# Patient Record
Sex: Male | Born: 1955 | Race: White | Hispanic: No | Marital: Married | Smoking: Never smoker
Health system: Southern US, Community
[De-identification: ages and names within clinical notes are randomized; demographics above are authoritative.]

## PROBLEM LIST (undated history)

## (undated) DIAGNOSIS — N529 Male erectile dysfunction, unspecified: Secondary | ICD-10-CM

## (undated) DIAGNOSIS — K760 Fatty (change of) liver, not elsewhere classified: Secondary | ICD-10-CM

## (undated) DIAGNOSIS — Z961 Presence of intraocular lens: Secondary | ICD-10-CM

## (undated) DIAGNOSIS — E785 Hyperlipidemia, unspecified: Secondary | ICD-10-CM

## (undated) DIAGNOSIS — H269 Unspecified cataract: Secondary | ICD-10-CM

## (undated) DIAGNOSIS — I10 Essential (primary) hypertension: Secondary | ICD-10-CM

## (undated) DIAGNOSIS — H5210 Myopia, unspecified eye: Secondary | ICD-10-CM

## (undated) DIAGNOSIS — B029 Zoster without complications: Secondary | ICD-10-CM

## (undated) DIAGNOSIS — H579 Unspecified disorder of eye and adnexa: Secondary | ICD-10-CM

## (undated) DIAGNOSIS — H3320 Serous retinal detachment, unspecified eye: Secondary | ICD-10-CM

## (undated) DIAGNOSIS — H547 Unspecified visual loss: Secondary | ICD-10-CM

## (undated) HISTORY — DX: Unspecified disorder of eye and adnexa: H57.9

## (undated) HISTORY — DX: Essential (primary) hypertension: I10

## (undated) HISTORY — DX: Myopia, unspecified eye: H52.10

## (undated) HISTORY — DX: Fatty (change of) liver, not elsewhere classified: K76.0

## (undated) HISTORY — DX: Unspecified cataract: H26.9

## (undated) HISTORY — DX: Male erectile dysfunction, unspecified: N52.9

## (undated) HISTORY — DX: Zoster without complications: B02.9

## (undated) HISTORY — DX: Serous retinal detachment, unspecified eye: H33.20

## (undated) HISTORY — DX: Hyperlipidemia, unspecified: E78.5

## (undated) HISTORY — DX: Unspecified visual loss: H54.7

## (undated) HISTORY — DX: Presence of intraocular lens: Z96.1

---

## 1993-10-08 HISTORY — PX: OTHER SURGICAL HISTORY: SHX169

## 1993-10-08 HISTORY — PX: VITRECTOMY: SHX106

## 1999-02-21 ENCOUNTER — Ambulatory Visit (HOSPITAL_COMMUNITY): Admission: RE | Admit: 1999-02-21 | Discharge: 1999-02-21 | Payer: Self-pay | Admitting: Ophthalmology

## 1999-10-09 HISTORY — PX: CAPSULOTOMY: SHX379

## 2002-05-15 ENCOUNTER — Encounter: Payer: Self-pay | Admitting: Ophthalmology

## 2002-05-16 ENCOUNTER — Ambulatory Visit (HOSPITAL_COMMUNITY): Admission: RE | Admit: 2002-05-16 | Discharge: 2002-05-17 | Payer: Self-pay | Admitting: Ophthalmology

## 2004-12-12 ENCOUNTER — Ambulatory Visit (HOSPITAL_COMMUNITY): Admission: RE | Admit: 2004-12-12 | Discharge: 2004-12-12 | Payer: Self-pay | Admitting: Ophthalmology

## 2009-06-23 ENCOUNTER — Observation Stay (HOSPITAL_COMMUNITY): Admission: EM | Admit: 2009-06-23 | Discharge: 2009-06-25 | Payer: Self-pay | Admitting: Emergency Medicine

## 2011-01-12 LAB — CBC
Hemoglobin: 14.3 g/dL (ref 13.0–17.0)
Hemoglobin: 14.9 g/dL (ref 13.0–17.0)
MCHC: 34.3 g/dL (ref 30.0–36.0)
RBC: 4.65 MIL/uL (ref 4.22–5.81)
RDW: 12.4 % (ref 11.5–15.5)
WBC: 7.6 10*3/uL (ref 4.0–10.5)

## 2011-01-12 LAB — DIFFERENTIAL
Basophils Relative: 0 % (ref 0–1)
Basophils Relative: 0 % (ref 0–1)
Eosinophils Absolute: 0 10*3/uL (ref 0.0–0.7)
Eosinophils Absolute: 0 10*3/uL (ref 0.0–0.7)
Eosinophils Relative: 0 % (ref 0–5)
Eosinophils Relative: 1 % (ref 0–5)
Monocytes Absolute: 0.7 10*3/uL (ref 0.1–1.0)
Monocytes Relative: 11 % (ref 3–12)
Neutrophils Relative %: 65 % (ref 43–77)
Neutrophils Relative %: 65 % (ref 43–77)

## 2011-01-12 LAB — COMPREHENSIVE METABOLIC PANEL
ALT: 23 U/L (ref 0–53)
ALT: 27 U/L (ref 0–53)
AST: 16 U/L (ref 0–37)
AST: 18 U/L (ref 0–37)
Albumin: 3.4 g/dL — ABNORMAL LOW (ref 3.5–5.2)
Alkaline Phosphatase: 61 U/L (ref 39–117)
Alkaline Phosphatase: 70 U/L (ref 39–117)
CO2: 26 mEq/L (ref 19–32)
Calcium: 8.5 mg/dL (ref 8.4–10.5)
Chloride: 105 mEq/L (ref 96–112)
GFR calc Af Amer: >60 mL/min (ref >60)
GFR calc non Af Amer: >60 mL/min (ref >60)
Glucose, Bld: 124 mg/dL — ABNORMAL HIGH (ref 70–99)
Glucose, Bld: 89 mg/dL (ref 70–99)
Potassium: 3.8 mEq/L (ref 3.5–5.1)
Potassium: 4 mEq/L (ref 3.5–5.1)
Sodium: 138 mEq/L (ref 135–145)
Sodium: 140 mEq/L (ref 135–145)
Total Bilirubin: 1 mg/dL (ref 0.3–1.2)
Total Protein: 6.2 g/dL (ref 6.0–8.3)

## 2011-01-12 LAB — URINALYSIS, ROUTINE W REFLEX MICROSCOPIC
Bilirubin Urine: NEGATIVE
Nitrite: NEGATIVE
Specific Gravity, Urine: >1.046 — ABNORMAL HIGH (ref 1.005–1.030)
pH: 5.5 (ref 5.0–8.0)

## 2011-01-12 LAB — HEPATIC FUNCTION PANEL
AST: 17 U/L (ref 0–37)
Bilirubin, Direct: 0.1 mg/dL (ref 0.0–0.3)
Indirect Bilirubin: 0.7 mg/dL (ref 0.3–0.9)
Total Bilirubin: 0.8 mg/dL (ref 0.3–1.2)

## 2011-01-12 LAB — LACTIC ACID, PLASMA: Lactic Acid, Venous: 1.1 mmol/L (ref 0.5–2.2)

## 2011-01-12 LAB — POCT CARDIAC MARKERS: Myoglobin, poc: 104 ng/mL (ref 12–200)

## 2011-01-12 LAB — LIPASE, BLOOD: Lipase: 30 U/L (ref 11–59)

## 2011-02-23 NOTE — H&P (Signed)
NAME:  Raymond Mann, Raymond Mann NO.:  [id number]   MEDICAL RECORD NO.:  [id number]          PATIENT TYPE:  OIB   LOCATION:  2899                         FACILITY:  [facility]   PHYSICIAN:  Guadelupe Sabin, M.D.DATE OF BIRTH:  1956-04-02   DATE OF ADMISSION:  12/12/2004  DATE OF DISCHARGE:                                HISTORY & PHYSICAL   REASON FOR ADMISSION:  This was a planned outpatient readmission of this 55-  year-old, white male admitted for cataract implant surgery of the right eye.   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  Subsequently, he has also had retinal  detachment surgery in 2003, on the right eye, and has had a YAG laser  capsulotomy of the left eye in 2001.  The patient now has noted  deterioration of vision in the right eye with reduction to 20/70 at distance  and 20/100 near.  Nuclear cataract formation is present and the patient has  elected to proceed with cataract implant surgery hoping to improve vision.  He was given oral discussion and printed information and signed an informed  consent.  Arrangements were made for his outpatient admission at this time.   PAST MEDICAL HISTORY:  The patient is healthy, nonsmoker, married, white  male who takes only occasional alcohol and has just had a recent physical  examination felt to be in satisfactory good general health by Dr. Julian Reil.   ALLERGIES:  No known drug allergies.   CURRENT MEDICATIONS:  Adderall XR 30 mg 2 tablets a day.   REVIEW OF SYSTEMS:  No cardiorespiratory complaints.   PHYSICAL EXAMINATION:  VITAL SIGNS:  As recorded on admission, blood  pressure 124/91, heart rate 100, respirations 18, temperature 97.7.  GENERAL:  The patient is a slightly anxious, well-nourished, well-developed,  55 year old, white male in no acute distress.  HEENT: EYES:  Visual  acuity as noted above.  Slit lamp examination nuclear  cataract formation right eye, posterior chamber implant left eye.  Dilated  fundus examination shows a clear vitreous and attached retina with old  scleral buckling indentation. CHEST/LUNGS:  Clear to percussion and  auscultation.  HEART:  Normal sinus rhythm, no cardiomegaly. No murmurs.  ABDOMEN:  Negative.  EXTREMITIES:  Negative.   ADMISSION DIAGNOSES:  1.  Senile nuclear cataract right eye.  2.  Pseudophakia, left eye.  3.  Status post scleral buckling for retinal detachment, both eyes.   PLAN:  Cataract implant surgery, right eye.      HNJ/MEDQ  D:  12/12/2004  T:  12/12/2004  Job:  [id number]

## 2011-02-23 NOTE — Op Note (Signed)
NAME:  Raymond Mann, Raymond Mann NO.:  [id number]   MEDICAL RECORD NO.:  [id number]          PATIENT TYPE:  OIB   LOCATION:  2899                         FACILITY:  [facility]   PHYSICIAN:  Guadelupe Sabin, M.D.DATE OF BIRTH:  August 23, 1956   DATE OF PROCEDURE:  12/12/2004  DATE OF DISCHARGE:                                 OPERATIVE REPORT   .   PREOPERATIVE DIAGNOSIS:  Senile nuclear cataract, right eye; status post  previous scleral buckling procedure for retinal detachment, right eye.   POSTOPERATIVE DIAGNOSIS:  Senile nuclear cataract, right eye; status post  previous scleral buckling procedure for retinal detachment, right eye.   OPERATION:  Planned extracapsular cataract extraction --  phacoemulsification, primary insertion of posterior chamber intraocular lens  implant.   SURGEON:  Guadelupe Sabin, MD   ASSISTANT:  Nurse.   ANESTHESIA:  Local 4% Xylocaine, 0.75% Marcaine retrobulbar block was Wydase  added, topical tetracaine, intraocular Xylocaine, anesthesia standby  required in this patient, patient given Ditropan intravenously during the  period of retrobulbar blocking.   OPERATIVE PROCEDURE:  After the patient was prepped and draped, a lid  speculum was inserted in the right eye.  The eye was turned downward and a  superior rectus traction suture placed.  Schiotz tonometry was recorded at 7-  8 scale units with a 5.5-gram weight.  A peritomy was performed adjacent to  the limbus from the 11 to 1 o'clock position.  The corneoscleral junction  was cleaned and a corneoscleral groove made with a 45-degree Superblade.  The anterior chamber was then entered with a 2.5-mm diamond keratome at the  12 o'clock position and a 15-degree blade at the 2:30 position.  Using a  bent 26-gauge needle on an Ocucoat syringe, a circular capsulorrhexis was  begun and then completed with the Grabow forceps.  Hydrodissection and  hydrodelineation were performed using 1%  Xylocaine.  The 30-degree  phacoemulsification tip was then inserted with slow controlled  emulsification of the lens nucleus with fragmentation with the Bechert pick.  Total ultrasonic time -- 48 seconds, average power level -- 10%,  total  amount of fluid used -- 40 mL.  Following removal of the nucleus, the  residual cortex was aspirated with the irrigation-aspiration tip.  The  posterior capsule appeared intact with a brilliant red fundus reflex; it was  therefore elected to insert an [employer] [id number] silicone 3-  piece posterior chamber intraocular lens implant, diopter strength +10.00.  This was inserted with the McDonald forceps into the anterior chamber and  then centered into the capsular bag using the [facility] lens rotator; the lens  appeared to be well-centered.  The Ocucoat and Provisc which had been used  intermittently during the procedures were aspirated and replaced with  balanced salt solution and Miochol ophthalmic solution.  The operative  incisions appeared to be self-sealing; it was, however, elected to place a  10-0 interrupted nylon single suture across the 12 o'clock incision to  ensure closure and to prevent  endophthalmitis.  Maxitrol ointment was instilled in the conjunctival cul-de-  sac  and a light patch and protector shield applied.  Duration of procedure  and anesthesia administration -- 45 minutes.  The patient tolerated the  procedure well in general and left the operating room for the recovery room  in good condition.      HNJ/MEDQ  D:  12/12/2004  T:  12/12/2004  Job:  [id number]

## 2011-02-23 NOTE — Op Note (Signed)
NAME:  Raymond Mann, Raymond Mann                           ACCOUNT NO.:  [id number]   MEDICAL RECORD NO.:  [id number]                   PATIENT TYPE:  OIB   LOCATION:  2899                                 FACILITY:  [facility]   PHYSICIAN:  Guadelupe Sabin, M.D.             DATE OF BIRTH:  November 17, 1955   DATE OF PROCEDURE:  DATE OF DISCHARGE:                                 OPERATIVE REPORT   PREOPERATIVE DIAGNOSIS:  Rhegmatogenous retinal detachment right eye, single  defect.   POSTOPERATIVE DIAGNOSIS:  Rhegmatogenous retinal detachment right eye,  single defect.   NAME OF OPERATION:  1. Scleral buckling procedure right eye using solid silicone implants.  2. A 77 and 240 cryoapplication, diathermy application, external drainage of     subretinal fluid.   SURGEON:  Guadelupe Sabin, M.D.   ASSISTANT:  Nurse.   ANESTHESIA:  General.   Ophthalmoscopy as previously described.   OPERATIVE PROCEDURE:  After the patient was prepped and draped, lid traction  sutures were placed in the right upper and lower lids.  A lid speculum was  inserted.  A 360 degree peritomy was performed.  The subconjunctival tissue  was cleaned and 4 rectus traction sutures placed with 4-0 silk.  The sclera  was inspected and felt to be in satisfactory condition for lamellar scleral  dissection.  Indirect ophthalmoscopy was performed and the retinal tear  localized at the 6 o'clock position and treated with cryoapplication.  It  was then elected to perform lamellar scleral dissection from the 4-8 o'clock  position, the bed measuring 9 mm in width.  Light diathermia applications  were applied to the intrascleral lamella.  A total of four 4-0 green  Mersilene sutures were used to close the scleral flaps over a trimmed #277  solid silicone implant with 3-0 plain catgut reinforcements.  A #240 solid  silicone encircling band was placed about the globe at the equator, tied  with a suture at the 2 o'clock position.   Anchoring sutures of 5-0 white  Dacron were placed at the 1:30 and 10 o'clock position to hold the  encircling band at the equator.  After repeat indirect ophthalmoscopy it was  elected to drain fluid in the bed at the 6:30 position.  Incision was made  through the intrascleral lamella, the choroid exposed, treated with light  diathermia and then perforated with a pin electrode.  An abundant amount of  clear subretinal fluid drained and then stopped.  The scleral sutures were  then pulled up creating a scleral buckling indentation.  Tension of the band  was adjusted.  The fundus was inspected revealing flattening of the retina  with no residual subretinal fluid.  It was then elected to close.  Tenon's  capsule was pulled forward in the 4 quadrants and tied as a separate layer  with 6-0 chromic catgut.  The conjunctival was then pulled forward and  closed with a running 6-0 chromic catgut suture.  Neosporin Ophthalmic  Solution was irrigated in the sub Tenon space as closure was begun.  Garamycin and Celestone or dexamethazone were injected in the sub Tenon  space inferiorly.  Maxitrol and atropine ointment were instilled in the conjunctival cul-de-sac  and the light patch and protector shield applied to the operated right eye.  Duration of procedure and anesthesia administration: 1 1/2 hours.  Patient  tolerated the procedure well in general, left the operating room for the  recovery room in good condition.                                                 Guadelupe Sabin, M.D.    HNJ/MEDQ  D:  05/16/2002  T:  05/20/2002  Job:  [id number]

## 2011-02-23 NOTE — Discharge Summary (Signed)
   NAME:  Raymond Mann, Raymond Mann                           ACCOUNT NO.:  [id number]   MEDICAL RECORD NO.:  [id number]                   PATIENT TYPE:  OIB   LOCATION:  5725                                 FACILITY:  [facility]   PHYSICIAN:  Guadelupe Sabin, M.D.             DATE OF BIRTH:  Jun 19, 1956   DATE OF ADMISSION:  05/16/2002  DATE OF DISCHARGE:  05/17/2002                                 DISCHARGE SUMMARY   HISTORY OF PRESENT ILLNESS:  This was an urgent outpatient admission of this  55 year old white male, admitted with a rhegmatogenous retinal detachment of  the right eye. The patient had previous 1995 retinal detachment surgery of  the left eye.   HOSPITAL COURSE:  The patient was evaluated and felt to be in satisfactory  condition for the proposed surgery. He therefore was taken into the  operating room where a scleral buckling procedure was performed on the right  eye under general anesthesia. The patient tolerated the hour and a half  procedure well and was taken to the recovery room and subsequently to the 23-  hour observation unit. The patient was seen on the evening of surgery and  the following morning and felt to be progressing with settling of the retina  on the scleral indentation implant. The patient was felt ready for discharge  and given a printed list of discharge instructions on the care and use of  the operated eye.   DISCHARGE MEDICATIONS:  Discharge ocular medications included:  1. FML ophthalmic solution 1 drop four times a day.  2. Cyclomydril ophthalmic solution 1 drop four times a day.  3. Digamox or Ocuflox ophthalmic solution 1 drop four times a day.   ACTIVITY:  The patient to limit activity.   FOLLOWUP:  Patient to call the office for followup appointment on Monday for  Wednesday or Thursday of this week.   CONDITION ON DISCHARGE:  Improved.   DISCHARGE DIAGNOSES:  Rhegmatogenous retinal detachment, right eye, single  defect, status post previous  scleral buckling for retinal detachment, left  eye 1995.                                                Guadelupe Sabin, M.D.    HNJ/MEDQ  D:  05/17/2002  T:  05/21/2002  Job:  [id number]

## 2011-02-23 NOTE — H&P (Signed)
NAME:  Raymond Mann, Raymond Mann NO.:  [id number]   MEDICAL RECORD NO.:  [id number]                   PATIENT TYPE:  OIB   LOCATION:  2899                                 FACILITY:  [facility]   PHYSICIAN:  Guadelupe Sabin, M.D.             DATE OF BIRTH:  10-27-1955   DATE OF ADMISSION:  05/16/2002  DATE OF DISCHARGE:                                HISTORY & PHYSICAL   This was an urgent outpatient admission of this 55 year old white male  admitted for a retinal detachment of his right eye.   PRESENT ILLNESS:  This patient has a long history of myopia and secondary  complications of retinal detachment.  He was previously admitted on November 03, 1993 for a scleral buckling procedure left eye and cryopexy right eye.  On Feb 06, 1994, a revision of the scleral buckling with vitrectomy was  performed on the left eye.  Later on Feb 21, 1999, a cataract implant  surgery was performed on the left eye and on May 30, 2000, a secondary  Yag laser capsulotomy.  Following these multiple procedures the patient has  done well with the left eye with maintenance of good vision at 20/20.  Recently, however, he began to note the onset of flashes and floaters in his  right eye.  He was seen on May 05, 2002 at which time a vitreous floater  and posterior vitreous detachment was noted but no retinal tear or  detachment seen and there were no pigment spots in the vitreous.  Later,  however, he began to note the onset of a visual field defect in the right  eye and was seen in the office on May 15, 2002 at which time an inferior  retinal detachment was found with a horseshoe tear at the 6 o'clock  position.  Arrangements were made for his outpatient admission at this time.   PAST MEDICAL HISTORY:  Good general health.  No serious illnesses.  Patient  is under the care of Dr. Viviann Spare A. Daub at the [facility] [place].   REVIEW OF SYSTEMS:  No cardiorespiratory  complaints.   PHYSICAL EXAMINATION:  Vital signs as recorded on admission: Blood pressure  119/81, temperature 97.8, heart rate 88, respirations 16.  GENERAL APPEARANCE: Patient is a pleasant, well-nourished, anxious, white  male in acute ocular distress.  HEENT: Eyes: Visual acuity 20/25+ right eye, 20/20 left eye.  External  ocular and slit lamp examination as described above.  Detailed fundus  examination right eye shows an inferior retinal detachment with a horseshoe  tear at the 6 o'clock position.  The macular area is still attached.  CHEST: Lungs clear to percussion and auscultation.  HEART: Normal sinus rhythm.  No cardiomegaly.  No murmurs.  ABDOMEN: Negative.  EXTREMITIES: Negative.   ADMISSION DIAGNOSES:  1. Rhegmatogenous retinal detachment right eye status post retinal  detachment repair left eye 1995.  2. Cataract implant surgery 2000 left eye.    SURGICAL PLAN:  Scleral buckling procedure right eye under general  anesthesia.  Patient has been given oral discussion and printed information  concerning the procedure and its complications.  He signed an informed  consent and arrangements made for his outpatient admission at this time.                                               Guadelupe Sabin, M.D.    HNJ/MEDQ  D:  05/16/2002  T:  05/20/2002  Job:  [id number]   cc:   Brett Canales A. Cleta Alberts, M.D.

## 2011-09-10 ENCOUNTER — Encounter: Payer: Self-pay | Admitting: Cardiovascular Disease

## 2011-09-17 ENCOUNTER — Other Ambulatory Visit: Payer: Self-pay | Admitting: Family Medicine

## 2011-09-17 DIAGNOSIS — R609 Edema, unspecified: Secondary | ICD-10-CM

## 2011-09-24 ENCOUNTER — Ambulatory Visit
Admission: RE | Admit: 2011-09-24 | Discharge: 2011-09-24 | Disposition: A | Discharge status: Home / Self Care | Payer: No Typology Code available for payment source | Source: Ambulatory Visit | Attending: Family Medicine | Admitting: Family Medicine

## 2011-09-24 DIAGNOSIS — R609 Edema, unspecified: Secondary | ICD-10-CM

## 2011-09-24 MED ORDER — IOHEXOL 300 MG/ML  SOLN
75.0000 mL | Freq: Once | INTRAMUSCULAR | Status: AC | PRN
  Administered 2011-09-24: 75 mL via INTRAVENOUS

## 2012-01-24 ENCOUNTER — Encounter: Payer: Self-pay | Admitting: *Deleted

## 2012-01-25 ENCOUNTER — Ambulatory Visit (HOSPITAL_COMMUNITY): Payer: BC Managed Care – PPO | Attending: Cardiovascular Disease

## 2012-01-25 ENCOUNTER — Ambulatory Visit (INDEPENDENT_AMBULATORY_CARE_PROVIDER_SITE_OTHER): Payer: BC Managed Care – PPO | Admitting: Cardiovascular Disease

## 2012-01-25 ENCOUNTER — Encounter: Payer: Self-pay | Admitting: *Deleted

## 2012-01-25 ENCOUNTER — Encounter: Payer: Self-pay | Admitting: Cardiovascular Disease

## 2012-01-25 ENCOUNTER — Other Ambulatory Visit (HOSPITAL_COMMUNITY): Payer: Self-pay | Admitting: Cardiovascular Disease

## 2012-01-25 ENCOUNTER — Ambulatory Visit (HOSPITAL_BASED_OUTPATIENT_CLINIC_OR_DEPARTMENT_OTHER): Payer: BC Managed Care – PPO

## 2012-01-25 DIAGNOSIS — E669 Obesity, unspecified: Secondary | ICD-10-CM | POA: Insufficient documentation

## 2012-01-25 DIAGNOSIS — R0989 Other specified symptoms and signs involving the circulatory and respiratory systems: Secondary | ICD-10-CM

## 2012-01-25 DIAGNOSIS — R072 Precordial pain: Secondary | ICD-10-CM

## 2012-01-25 DIAGNOSIS — R079 Chest pain, unspecified: Secondary | ICD-10-CM | POA: Insufficient documentation

## 2012-01-25 NOTE — Progress Notes (Signed)
Patient ID: Raymond Mann, male   DOB: 1956/04/29, 56 y.o.   MRN: [id number] 56 yo patient of Kevan Ny PA-c.  Reviewed records from last 2 years ( time 15 minutes)  [place] my neighborhood and came to my house yesterday wit [facility].  Has no previously documented CAD.  Woke with pain Wendsday am.  Muscular sounding left precordium radiating to shoulder.  Lasted about 2-3 hours and subsided.  He took ASA.  Recurred Tuesday am same situation.  No dypnea palpitations or syncope.  Chest feels bruised this am.  He has gained weight and is sedentary.  Denies anything else new or recent trauma or strain.  ECG in 2012 was normal.    ROS: Denies fever, malais, weight loss, blurry vision, decreased visual acuity, cough, sputum, SOB, hemoptysis, pleuritic pain, palpitaitons, heartburn, abdominal pain, melena, lower extremity edema, claudication, or rash.  All other systems reviewed and negative   General: Affect appropriate Healthy:  appears stated age HEENT: normal Neck supple with no adenopathy JVP normal no bruits no thyromegaly Lungs clear with no wheezing and good diaphragmatic motion Heart:  S1/S2 no murmur,rub, gallop or click PMI normal Abdomen: benighn, BS positve, no tenderness, no AAA no bruit.  No HSM or HJR Distal pulses intact with no bruits No edema Neuro non-focal Skin warm and dry No muscular weakness  Medications Current Outpatient Prescriptions  Medication Sig Dispense Refill  . aspirin 81 MG tablet Take 81 mg by mouth daily.        Allergies Bee  Family History: Family History  Problem Relation Age of Onset  . Stroke Father   . Cancer Mother     Social History: History   Social History  . Marital Status: Single    Spouse Name: N/A    Number of Children: N/A  . Years of Education: N/A   Occupational History  . Not on file.   Social History Main Topics  . Smoking status: Never Smoker   . Smokeless tobacco: Never Used  . Alcohol Use: Yes   occasionally  . Drug Use: No  . Sexually Active: Not on file   Other Topics Concern  . Not on file   Social History Narrative  . No narrative on file    Electrocardiogram:  NSR rate 110 patient nervous  Read as nonspecific ST/T wave changes but looks normal to me  Assessment and Plan

## 2012-01-25 NOTE — Assessment & Plan Note (Signed)
Sounds more muscular. ECG ok.  F/U stress echo latter today.  Continue baby aspirin

## 2018-04-15 ENCOUNTER — Ambulatory Visit: Payer: Self-pay | Admitting: Adult Health

## 2018-04-30 ENCOUNTER — Ambulatory Visit (INDEPENDENT_AMBULATORY_CARE_PROVIDER_SITE_OTHER): Payer: No Typology Code available for payment source | Admitting: Adult Health

## 2018-04-30 ENCOUNTER — Encounter

## 2018-04-30 ENCOUNTER — Encounter: Payer: Self-pay | Admitting: Adult Health

## 2018-04-30 VITALS — BP 110/76 | Temp 98.3°F | Wt 232.0 lb

## 2018-04-30 DIAGNOSIS — I10 Essential (primary) hypertension: Secondary | ICD-10-CM | POA: Diagnosis not present

## 2018-04-30 DIAGNOSIS — Z7689 Persons encountering health services in other specified circumstances: Secondary | ICD-10-CM

## 2018-04-30 DIAGNOSIS — N529 Male erectile dysfunction, unspecified: Secondary | ICD-10-CM | POA: Insufficient documentation

## 2018-04-30 DIAGNOSIS — E782 Mixed hyperlipidemia: Secondary | ICD-10-CM | POA: Diagnosis not present

## 2018-04-30 DIAGNOSIS — E785 Hyperlipidemia, unspecified: Secondary | ICD-10-CM | POA: Insufficient documentation

## 2018-04-30 NOTE — Progress Notes (Signed)
Patient presents to clinic today to establish care. He is a pleasant 62 year old male who  has a past medical history of Cataract of right eye, ED (erectile dysfunction), Essential hypertension, Hyperlipidemia, Myopia, Ocular disease, Pseudophakia of left eye, Retinal detachment, Shingles, and Sight deterioration.  His last physical was in December 2018    Acute Concerns: Establish Care   Chronic Issues: Essential Hypertension - lisinopril 20 mg daily.   Hyperlipidemia - Takes pravastatin   Health Maintenance: Dental -- Routine Care  Vision -- Routine Care  Immunizations -- UTD  Colonoscopy -- 2018 - 5 year plan.  Diet: Tries to eat healthy and does not eat a lot of fast food.  Exercise: Does not do routine exercise     Treatment Team - Ophthalmologists - Dr. Luciana Axe.   Past Medical History:  Diagnosis Date  . Cataract of right eye   . ED (erectile dysfunction)   . Essential hypertension   . Hyperlipidemia   . Myopia   . Ocular disease   . Pseudophakia of left eye   . Retinal detachment    both eyes  . Shingles   . Sight deterioration    right eye    Past Surgical History:  Procedure Laterality Date  . bilateral retinal detachment     x2 each eye  . CAPSULOTOMY  2001   left eye  . cryopexy  1995   right eye  . scleral buckling  1995   left eye  . VITRECTOMY  1995   both eyes    Current Outpatient Medications on File Prior to Visit  Medication Sig Dispense Refill  . aspirin 81 MG tablet Take 81 mg by mouth daily.    Marland Kitchen lisinopril (PRINIVIL,ZESTRIL) 20 MG tablet 20 mg every morning.    . pravastatin (PRAVACHOL) 20 MG tablet 20 mg every morning.    . sildenafil (REVATIO) 20 MG tablet Take 2-5 tablets as needed     No current facility-administered medications on file prior to visit.     Allergies  Allergen Reactions  . Bee Pollen Anaphylaxis  . Nutritional Supplements     BEE STING  . Nutritional Supplements Other (See Comments)    Itching,  possibly    Family History  Problem Relation Age of Onset  . Stroke Father   . Heart attack Father   . Dementia Mother   . Stomach cancer Mother 87  . Retinal detachment Brother     Social History   Socioeconomic History  . Marital status: Single    Spouse name: Not on file  . Number of children: Not on file  . Years of education: Not on file  . Highest education level: Not on file  Occupational History  . Not on file  Social Needs  . Financial resource strain: Not on file  . Food insecurity:    Worry: Not on file    Inability: Not on file  . Transportation needs:    Medical: Not on file    Non-medical: Not on file  Tobacco Use  . Smoking status: Never Smoker  . Smokeless tobacco: Never Used  Substance and Sexual Activity  . Alcohol use: Yes    Comment: occasionally  . Drug use: No  . Sexual activity: Not on file  Lifestyle  . Physical activity:    Days per week: Not on file    Minutes per session: Not on file  . Stress: Not on file  Relationships  . Social  connections:    Talks on phone: Not on file    Gets together: Not on file    Attends religious service: Not on file    Active member of club or organization: Not on file    Attends meetings of clubs or organizations: Not on file    Relationship status: Not on file  . Intimate partner violence:    Fear of current or ex partner: Not on file    Emotionally abused: Not on file    Physically abused: Not on file    Forced sexual activity: Not on file  Other Topics Concern  . Not on file  Social History 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    One grandchild        Review of Systems  Constitutional: Negative.   Respiratory: Negative.   Cardiovascular: Negative.   Genitourinary: Negative.   Musculoskeletal: Negative.   Neurological: Negative.   All other systems reviewed and are negative.    BP 110/76   Temp 98.3 F (36.8 C) (Oral)   Wt 232 lb (105.2 kg)   BMI  31.46 kg/m   Physical Exam  Constitutional: He is oriented to person, place, and time. He appears well-developed and well-nourished. No distress.  Cardiovascular: Normal rate, regular rhythm, normal heart sounds and intact distal pulses.  Pulmonary/Chest: Effort normal and breath sounds normal.  Musculoskeletal: Normal range of motion. He exhibits no edema, tenderness or deformity.  Neurological: He is alert and oriented to person, place, and time.  Skin: Skin is warm and dry. Capillary refill takes less than 2 seconds. He is not diaphoretic.  Psychiatric: He has a normal mood and affect. His behavior is normal. Judgment and thought content normal.  Nursing note and vitals reviewed.    Assessment/Plan: 1. Encounter to establish care - Will follow up in December for CPE  - Follow up sooner if needed - Encouraged routine exercise and heart healthy diet. I would like him to lose about 15 -20 pounds   2. Mixed hyperlipidemia - Continue with statin  - Will check labs in December   3. Essential hypertension - BP trending on the low side per old office notes  - Will have him cut back on lisinopril from 20 mg to 10 mg. He monitors his BP at home  - Return precautions given   Shirline Freesory Izsak Meir, NP

## 2018-09-01 ENCOUNTER — Ambulatory Visit: Payer: Self-pay

## 2018-09-01 ENCOUNTER — Emergency Department (HOSPITAL_COMMUNITY)
Admission: EM | Admit: 2018-09-01 | Discharge: 2018-09-01 | Disposition: A | Discharge status: Home / Self Care | Payer: PRIVATE HEALTH INSURANCE | Attending: Emergency Medicine | Admitting: Emergency Medicine

## 2018-09-01 ENCOUNTER — Emergency Department (HOSPITAL_COMMUNITY): Payer: PRIVATE HEALTH INSURANCE

## 2018-09-01 ENCOUNTER — Encounter (HOSPITAL_COMMUNITY): Payer: Self-pay | Admitting: Emergency Medicine

## 2018-09-01 ENCOUNTER — Other Ambulatory Visit: Payer: Self-pay

## 2018-09-01 DIAGNOSIS — R110 Nausea: Secondary | ICD-10-CM | POA: Diagnosis not present

## 2018-09-01 DIAGNOSIS — R1013 Epigastric pain: Secondary | ICD-10-CM | POA: Diagnosis present

## 2018-09-01 DIAGNOSIS — Z79899 Other long term (current) drug therapy: Secondary | ICD-10-CM | POA: Insufficient documentation

## 2018-09-01 DIAGNOSIS — I10 Essential (primary) hypertension: Secondary | ICD-10-CM | POA: Diagnosis not present

## 2018-09-01 LAB — BASIC METABOLIC PANEL
ANION GAP: 12 (ref 5–15)
BUN: 14 mg/dL (ref 8–23)
CALCIUM: 9.3 mg/dL (ref 8.9–10.3)
CO2: 18 mmol/L — AB (ref 22–32)
Chloride: 107 mmol/L (ref 98–111)
Creatinine, Ser: 1.2 mg/dL (ref 0.61–1.24)
GFR calc Af Amer: >60 mL/min (ref >60)
GLUCOSE: 190 mg/dL — AB (ref 70–99)
Potassium: 3.9 mmol/L (ref 3.5–5.1)
Sodium: 137 mmol/L (ref 135–145)

## 2018-09-01 LAB — CBC
HCT: 46.5 % (ref 39.0–52.0)
Hemoglobin: 15.4 g/dL (ref 13.0–17.0)
MCH: 30.1 pg (ref 26.0–34.0)
MCHC: 33.1 g/dL (ref 30.0–36.0)
MCV: 91 fL (ref 80.0–100.0)
PLATELETS: 172 10*3/uL (ref 150–400)
RBC: 5.11 MIL/uL (ref 4.22–5.81)
RDW: 12.1 % (ref 11.5–15.5)
WBC: 7.9 10*3/uL (ref 4.0–10.5)
nRBC: 0 % (ref 0.0–0.2)

## 2018-09-01 LAB — BRAIN NATRIURETIC PEPTIDE: B Natriuretic Peptide: 20.5 pg/mL (ref 0.0–100.0)

## 2018-09-01 LAB — I-STAT TROPONIN, ED
Troponin i, poc: 0 ng/mL (ref 0.00–0.08)
Troponin i, poc: 0.01 ng/mL (ref 0.00–0.08)

## 2018-09-01 LAB — HEPATIC FUNCTION PANEL
ALBUMIN: 4.3 g/dL (ref 3.5–5.0)
ALK PHOS: 66 U/L (ref 38–126)
ALT: 74 U/L — AB (ref 0–44)
AST: 86 U/L — ABNORMAL HIGH (ref 15–41)
BILIRUBIN INDIRECT: 1 mg/dL — AB (ref 0.3–0.9)
Bilirubin, Direct: 0.2 mg/dL (ref 0.0–0.2)
TOTAL PROTEIN: 7.3 g/dL (ref 6.5–8.1)
Total Bilirubin: 1.2 mg/dL (ref 0.3–1.2)

## 2018-09-01 LAB — LIPASE, BLOOD: Lipase: 39 U/L (ref 11–51)

## 2018-09-01 MED ORDER — IOPAMIDOL (ISOVUE-370) INJECTION 76%
INTRAVENOUS | Status: AC
  Filled 2018-09-01: qty 100

## 2018-09-01 MED ORDER — ALUM & MAG HYDROXIDE-SIMETH 200-200-20 MG/5ML PO SUSP
30.0000 mL | Freq: Once | ORAL | Status: AC
  Administered 2018-09-01: 30 mL via ORAL
  Filled 2018-09-01: qty 30

## 2018-09-01 MED ORDER — SODIUM CHLORIDE 0.9 % IV BOLUS
1000.0000 mL | Freq: Once | INTRAVENOUS | Status: AC
  Administered 2018-09-01: 1000 mL via INTRAVENOUS

## 2018-09-01 MED ORDER — IOPAMIDOL (ISOVUE-370) INJECTION 76%
100.0000 mL | Freq: Once | INTRAVENOUS | Status: AC | PRN
  Administered 2018-09-01: 100 mL via INTRAVENOUS

## 2018-09-01 NOTE — ED Notes (Signed)
ED Provider at bedside. 

## 2018-09-01 NOTE — ED Notes (Signed)
RN called to get lab to run add ons.

## 2018-09-01 NOTE — Discharge Instructions (Addendum)
Please follow up with your primary care physician as needed. If you experience any chest pain, shortness of breath or worsening symptoms please return to the ED for reevaluation.

## 2018-09-01 NOTE — ED Notes (Signed)
Pt returned from CT °

## 2018-09-01 NOTE — ED Provider Notes (Addendum)
MSE was initiated and I personally evaluated the patient and placed orders (if any) at  11:08 AM on September 01, 2018.  The patient appears stable so that the remainder of the MSE may be completed by another provider.  Complains of chest pain (points to epigastric area) onset 2 hours ago while in the bathroom.  He reports multiple episodes of diarrhea last night.  Also reports nausea and some shortness of breath.  Treated himself with ibuprofen, without relief on exam appears mildly anxious and uncomfortable lungs clear to auscultation heart regular rate and rhythm, mildly tachycardic abdomen nondistended nontender   Doug SouJacubowitz, Elexis Pollak, MD 09/01/18 1109    Doug SouJacubowitz, Beuford Garcilazo, MD 09/01/18 1109

## 2018-09-01 NOTE — Telephone Encounter (Signed)
rec'd call from pt. and daughter; reported [facility] way [facility]  Pt. talking, daughter driving car.  Daughter stated [facility]  the pt. C/o mid chest pain, nausea, shortness of breath x approx. 1.5 hrs.  Pt. stated when he takes a deep breath it is very painful.  Noted coughing.  Encouraged pt. To not try to talk any further, as daughter is approaching [facility].  Advised daughter to pull up to pt. Entrance and immediately request help for her father.  Daughter arrived at the ER about 10:54 AM.  Call ended, when ER personel arrived at the pt's side.     Reason for Disposition . SEVERE chest pain  Answer Assessment - Initial Assessment Questions 1. LOCATION: "Where does it hurt?"       Mid chest pain ;  2. RADIATION: "Does the pain go anywhere else?" (e.g., into neck, jaw, arms, back)      3. ONSET: "When did the chest pain begin?" (Minutes, hours or days)      9:00 AM 4. PATTERN "Does the pain come and go, or has it been constant since it started?"  "Does it get worse with exertion?"      constant 5. DURATION: "How long does it last" (e.g., seconds, minutes, hours)     Constant  6. SEVERITY: "How bad is the pain?"  (e.g., Scale 1-10; mild, moderate, or severe)    - MILD (1-3): doesn't interfere with normal activities     - MODERATE (4-7): interferes with normal activities or awakens from sleep    - SEVERE (8-10): excruciating pain, unable to do any normal activities       Severe 7. CARDIAC RISK FACTORS: "Do you have any history of heart problems or risk factors for heart disease?" (e.g., prior heart attack, angina; high blood pressure, diabetes, being overweight, high cholesterol, smoking, or strong family history of heart disease)    No prev. heart problems;  Pt. on BP medication; Father had CVA  8. PULMONARY RISK FACTORS: "Do you have any history of lung disease?"  (e.g., blood clots in lung, asthma, emphysema, birth control pills)     Did not  ask this question 9. CAUSE: "What do you think is causing the chest pain?"     Daughter thinks he is having heart attack  10. OTHER SYMPTOMS: "Do you have any other symptoms?" (e.g., dizziness, nausea, vomiting, sweating, fever, difficulty breathing, cough)       Nausea, short of breath, mid-chest pain; pain with deep breath, sweating, coughing  Protocols used: CHEST PAIN-A-AH

## 2018-09-01 NOTE — Telephone Encounter (Signed)
Noted.  Seen in ED.  Nothing further needed.

## 2018-09-01 NOTE — ED Provider Notes (Signed)
[facility] EMERGENCY DEPARTMENT Provider Note   CSN: [id number] Arrival date & time: 09/01/18  1056     History   Chief Complaint Chief Complaint  Patient presents with  . Chest Pain    HPI LINDWOOD MOGEL is a 62 y.o. male.  62 y.o male with a PMH HTN, Hyperlipidemia presents to the ED with a chief complaint of epigastric pain x 2 hours ago.  Patient has recently been staying with wife who is currently in the hospital but reports the symptoms began this morning, he reports along the epigastric region of his abdomen worse with deep inspiration.  Patient also reports 5 episodes of diarrhea last night along with his last episode of diarrhea being 3 hours ago.  Tried some ibuprofen for symtomatic treatment but states no relief.He also reports some back pain along the mid region. Patient reports feeling more short of breath when walking in the parking lot this morning.  Denies any nausea, chest pain, fever or previous history of CAD.      Past Medical History:  Diagnosis Date  . Cataract of right eye   . ED (erectile dysfunction)   . Essential hypertension   . Hyperlipidemia   . Myopia   . Ocular disease   . Pseudophakia of left eye   . Retinal detachment    both eyes  . Shingles   . Sight deterioration    right eye    Patient Active Problem List   Diagnosis Date Noted  . Hyperlipidemia   . Essential hypertension   . ED (erectile dysfunction)   . Chest pain 01/25/2012    Past Surgical History:  Procedure Laterality Date  . bilateral retinal detachment     x2 each eye  . CAPSULOTOMY  2001   left eye  . cryopexy  1995   right eye  . scleral buckling  1995   left eye  . VITRECTOMY  1995   both eyes        Home Medications    Prior to Admission medications   Medication Sig Start Date End Date Taking? Authorizing Provider  ibuprofen (ADVIL,MOTRIN) 200 MG tablet Take 400 mg by mouth as needed for moderate pain.   Yes [provider]  lisinopril (PRINIVIL,ZESTRIL) 20 MG tablet Take 20 mg by mouth daily.  01/04/17  Yes [provider]  pravastatin (PRAVACHOL) 20 MG tablet Take 20 mg by mouth daily.  01/07/17  Yes [provider]    Family History Family History  Problem Relation Age of Onset  . Stroke Father   . Heart attack Father   . Dementia Mother   . Stomach cancer Mother 70  . Retinal detachment Brother     Social History Social History   Tobacco Use  . Smoking status: Never Smoker  . Smokeless tobacco: Never Used  Substance Use Topics  . Alcohol use: Yes    Comment: occasionally  . Drug use: No     Allergies   Bee pollen   Review of Systems Review of Systems  Constitutional: Negative for fever.  HENT: Negative for sore throat.   Respiratory: Positive for shortness of breath.   Cardiovascular: Negative for chest pain and leg swelling.  Gastrointestinal: Positive for abdominal pain, diarrhea and nausea. Negative for vomiting.  Genitourinary: Negative for flank pain.  Musculoskeletal: Positive for back pain.  Neurological: Negative for light-headedness and headaches.     Physical Exam Updated Vital Signs BP 121/73   Pulse  83   Temp 98.4 F (36.9 C) (Oral)   Resp 17   Ht 6' (1.829 m)   Wt 106.6 kg   SpO2 95%   BMI 31.87 kg/m   Physical Exam  Constitutional: He is oriented to person, place, and time. He appears well-developed and well-nourished.  HENT:  Head: Normocephalic and atraumatic.  Mouth/Throat: Oropharynx is clear and moist.  Eyes: Pupils are equal, round, and reactive to light. No scleral icterus.  Neck: Normal range of motion.  Cardiovascular: Normal heart sounds.  Pulmonary/Chest: Effort normal and breath sounds normal. He has no wheezes. He exhibits no tenderness.  Abdominal: Soft. Bowel sounds are normal. He exhibits no distension. There is no tenderness.  Musculoskeletal: He exhibits no tenderness or deformity.       Right lower leg: He  exhibits no edema.       Left lower leg: He exhibits no edema.  Neurological: He is alert and oriented to person, place, and time.  Skin: Skin is warm and dry. No erythema.  Nursing note and vitals reviewed.    ED Treatments / Results  Labs (all labs ordered are listed, but only abnormal results are displayed) Labs Reviewed  BASIC METABOLIC PANEL - Abnormal; Notable for the following components:      Result Value   CO2 18 (*)    Glucose, Bld 190 (*)    All other components within normal limits  HEPATIC FUNCTION PANEL - Abnormal; Notable for the following components:   AST 86 (*)    ALT 74 (*)    Indirect Bilirubin 1.0 (*)    All other components within normal limits  CBC  BRAIN NATRIURETIC PEPTIDE  LIPASE, BLOOD  I-STAT TROPONIN, ED  I-STAT TROPONIN, ED    EKG EKG Interpretation  Date/Time:  Monday September 01 2018 12:02:31 EST Ventricular Rate:  109 PR Interval:  152 QRS Duration: 70 QT Interval:  332 QTC Calculation: 447 R Axis:   42 Text Interpretation:  Sinus tachycardia with Premature atrial complexes Nonspecific T wave abnormality Abnormal ECG No significant change was found Confirmed by Azalia Bilis ([id number]) on 09/01/2018 12:28:18 PM   Radiology Dg Chest Portable 1 View  Result Date: 09/01/2018 CLINICAL DATA:  Chest pain today. EXAM: PORTABLE CHEST 1 VIEW COMPARISON:  PA and lateral chest 06/25/2009. FINDINGS: Lung volumes are somewhat low but the lungs are clear. Heart size is normal. No pneumothorax or pleural effusion. No acute or focal bony abnormality. IMPRESSION: No acute disease. Electronically Signed   By: Drusilla Kanner M.D.   On: 09/01/2018 11:45   Ct Angio Chest/abd/pel For Dissection W And/or Wo Contrast  Result Date: 09/01/2018 CLINICAL DATA:  Chest and upper abdominal pain. EXAM: CT ANGIOGRAPHY CHEST, ABDOMEN AND PELVIS TECHNIQUE: Multidetector CT imaging through the chest, abdomen and pelvis was performed using the standard protocol during  bolus administration of intravenous contrast. Multiplanar reconstructed images and MIPs were obtained and reviewed to evaluate the vascular anatomy. CONTRAST:  ISOVUE-370 IOPAMIDOL (ISOVUE-370) INJECTION 76% COMPARISON:  CT scan of June 23, 2009. FINDINGS: CTA CHEST FINDINGS Cardiovascular: Satisfactory opacification of the pulmonary arteries to the segmental level. No evidence of pulmonary embolism. Normal heart size. No pericardial effusion. Mediastinum/Nodes: No enlarged mediastinal, hilar, or axillary lymph nodes. Thyroid gland, trachea, and esophagus demonstrate no significant findings. Lungs/Pleura: No pneumothorax is noted. Minimal bilateral pleural effusions are noted. No consolidative process is noted. Musculoskeletal: No chest wall abnormality. No acute or significant osseous findings. Review of the MIP images confirms  the above findings. CTA ABDOMEN AND PELVIS FINDINGS VASCULAR Aorta: Normal caliber aorta without aneurysm, dissection, vasculitis or significant stenosis. Celiac: Patent without evidence of aneurysm, dissection, vasculitis or significant stenosis. SMA: Patent without evidence of aneurysm, dissection, vasculitis or significant stenosis. Renals: Both renal arteries are patent without evidence of aneurysm, dissection, vasculitis, fibromuscular dysplasia or significant stenosis. IMA: Patent without evidence of aneurysm, dissection, vasculitis or significant stenosis. Inflow: Patent without evidence of aneurysm, dissection, vasculitis or significant stenosis. Veins: No obvious venous abnormality within the limitations of this arterial phase study. Review of the MIP images confirms the above findings. NON-VASCULAR Hepatobiliary: No gallstones or biliary dilatation is noted. Fatty infiltration of the liver is noted. 5.4 cm cyst is seen in right hepatic lobe. Pancreas: Unremarkable. No pancreatic ductal dilatation or surrounding inflammatory changes. Spleen: Normal in size without focal  abnormality. Adrenals/Urinary Tract: Stable right adrenal fat containing adenoma or myelolipoma. Left adrenal gland is unremarkable. No hydronephrosis or renal obstruction is noted. No renal or ureteral calculi are noted. Urinary bladder is unremarkable. Stomach/Bowel: Stomach is within normal limits. Appendix appears normal. No evidence of bowel wall thickening, distention, or inflammatory changes. Lymphatic: No significant vascular findings are present. No enlarged abdominal or pelvic lymph nodes. Reproductive: Prostate is unremarkable. Other: No abdominal wall hernia or abnormality. No abdominopelvic ascites. Musculoskeletal: No acute or significant osseous findings. Review of the MIP images confirms the above findings. IMPRESSION: No evidence of thoracic or abdominal aortic aneurysm or dissection. No evidence of significant mesenteric or renal artery stenosis. Fatty infiltration of the liver. 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 Right hepatic cyst. No acute abnormality seen in the abdomen or pelvis. Electronically Signed   By: Lupita RaiderJames  Green Jr, M.D.   On: 09/01/2018 14:23    Procedures Procedures (including critical care time)  Medications Ordered in ED Medications  iopamidol (ISOVUE-370) 76 % injection (has no administration in time range)  sodium chloride 0.9 % bolus 1,000 mL (0 mLs Intravenous Stopped 09/01/18 1341)  alum & mag hydroxide-simeth (MAALOX/MYLANTA) 200-200-20 MG/5ML suspension 30 mL (30 mLs Oral Given 09/01/18 1230)  iopamidol (ISOVUE-370) 76 % injection 100 mL (100 mLs Intravenous Contrast Given 09/01/18 1346)     Initial Impression / Assessment and Plan / ED Course  I have reviewed the triage vital signs and the nursing notes.  Pertinent labs & imaging results that were available during my care of the patient were reviewed by me and considered in my medical decision making (see chart for details).    Presents with epigastric pain which began 2 hours ago, he reports nausea but  has not vomited.  Patient has been in the hospital visiting wife states he ate breakfast at the hospital yesterday.  Exam patient is diaphoretic and clammy portable chest x-ray ordered, acute abnormalities, pleural effusion or consolidation.  Patient does report his epigastric pain sometimes radiates to his back obtain a CT chest along with a CT abdomen to rule out any dissection or acute abnormality.  CT abdomen and pelvis showed: No evidence of thoracic or abdominal aortic aneurysm or dissection.    No evidence of significant mesenteric or renal artery stenosis.    Fatty infiltration of the liver.    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    Right hepatic cyst.    No acute abnormality seen in the abdomen or pelvis.   Second troponin is negative, along with a normal EKG low suspicion for ACS. Patient reports he has breakfast at the hospital yesterday morning and the diarrhea began last  night. Vitals sign stable for discharge, patient reporting relieve with GI cocktail and prior to intervention. Close follow up with PCP, likely suffering from a viral enteritis. Return precautions provided.   Final Clinical Impressions(s) / ED Diagnoses   Final diagnoses:  Epigastric pain  Nausea    ED Discharge Orders    None       Claude Manges, Cordelia Poche 09/01/18 1536    Azalia Bilis, MD 09/01/18 1642

## 2018-09-01 NOTE — ED Triage Notes (Signed)
Pt to ER for evaluation of substernal chest pressure onset 2 hours ago, reports diarrhea last night "all night long" with "at least 5-10 episodes, reports shortness of breath. Worsened by deep breathing.

## 2018-09-01 NOTE — ED Notes (Signed)
Patient ambulatory to bathroom with steady gait at this time 

## 2018-09-01 NOTE — ED Notes (Signed)
Blue top blood tube drawn at 11:15. [facility].

## 2018-09-09 ENCOUNTER — Other Ambulatory Visit: Payer: Self-pay | Admitting: Adult Health

## 2018-09-09 ENCOUNTER — Ambulatory Visit (INDEPENDENT_AMBULATORY_CARE_PROVIDER_SITE_OTHER): Payer: No Typology Code available for payment source | Admitting: Adult Health

## 2018-09-09 ENCOUNTER — Encounter: Payer: Self-pay | Admitting: Adult Health

## 2018-09-09 ENCOUNTER — Other Ambulatory Visit (INDEPENDENT_AMBULATORY_CARE_PROVIDER_SITE_OTHER): Payer: No Typology Code available for payment source

## 2018-09-09 VITALS — BP 116/90 | Temp 98.3°F | Ht 71.0 in | Wt 235.0 lb

## 2018-09-09 DIAGNOSIS — K2100 Gastro-esophageal reflux disease with esophagitis, without bleeding: Secondary | ICD-10-CM

## 2018-09-09 DIAGNOSIS — Z1159 Encounter for screening for other viral diseases: Secondary | ICD-10-CM

## 2018-09-09 DIAGNOSIS — E782 Mixed hyperlipidemia: Secondary | ICD-10-CM

## 2018-09-09 DIAGNOSIS — I10 Essential (primary) hypertension: Secondary | ICD-10-CM

## 2018-09-09 DIAGNOSIS — Z0000 Encounter for general adult medical examination without abnormal findings: Secondary | ICD-10-CM

## 2018-09-09 DIAGNOSIS — Z114 Encounter for screening for human immunodeficiency virus [HIV]: Secondary | ICD-10-CM

## 2018-09-09 DIAGNOSIS — R7309 Other abnormal glucose: Secondary | ICD-10-CM

## 2018-09-09 DIAGNOSIS — Z125 Encounter for screening for malignant neoplasm of prostate: Secondary | ICD-10-CM

## 2018-09-09 LAB — POC URINALSYSI DIPSTICK (AUTOMATED)
Bilirubin, UA: NEGATIVE
Blood, UA: NEGATIVE
Glucose, UA: NEGATIVE
KETONES UA: NEGATIVE
LEUKOCYTES UA: NEGATIVE
Nitrite, UA: NEGATIVE
PROTEIN UA: NEGATIVE
Spec Grav, UA: >=1.03 — AB (ref 1.010–1.025)
Urobilinogen, UA: 0.2 E.U./dL
pH, UA: 5.5 (ref 5.0–8.0)

## 2018-09-09 LAB — CBC WITH DIFFERENTIAL/PLATELET
BASOS ABS: 0 10*3/uL (ref 0.0–0.1)
Basophils Relative: 0.7 % (ref 0.0–3.0)
EOS ABS: 0.1 10*3/uL (ref 0.0–0.7)
Eosinophils Relative: 1.4 % (ref 0.0–5.0)
HEMATOCRIT: 47.5 % (ref 39.0–52.0)
HEMOGLOBIN: 16.3 g/dL (ref 13.0–17.0)
LYMPHS PCT: 32.9 % (ref 12.0–46.0)
Lymphs Abs: 1.6 10*3/uL (ref 0.7–4.0)
MCHC: 34.2 g/dL (ref 30.0–36.0)
MCV: 92.3 fl (ref 78.0–100.0)
Monocytes Absolute: 0.3 10*3/uL (ref 0.1–1.0)
Monocytes Relative: 7.1 % (ref 3.0–12.0)
Neutro Abs: 2.9 10*3/uL (ref 1.4–7.7)
Neutrophils Relative %: 57.9 % (ref 43.0–77.0)
Platelets: 237 10*3/uL (ref 150.0–400.0)
RBC: 5.15 Mil/uL (ref 4.22–5.81)
RDW: 13.1 % (ref 11.5–15.5)
WBC: 4.9 10*3/uL (ref 4.0–10.5)

## 2018-09-09 LAB — COMPREHENSIVE METABOLIC PANEL
ALT: 35 U/L (ref 0–53)
AST: 13 U/L (ref 0–37)
Albumin: 4.4 g/dL (ref 3.5–5.2)
Alkaline Phosphatase: 63 U/L (ref 39–117)
BILIRUBIN TOTAL: 0.6 mg/dL (ref 0.2–1.2)
BUN: 17 mg/dL (ref 6–23)
CALCIUM: 9.3 mg/dL (ref 8.4–10.5)
CO2: 26 meq/L (ref 19–32)
CREATININE: 1.07 mg/dL (ref 0.40–1.50)
Chloride: 107 mEq/L (ref 96–112)
GFR: 73.91 mL/min (ref >60.00)
Glucose, Bld: 110 mg/dL — ABNORMAL HIGH (ref 70–99)
Potassium: 4.6 mEq/L (ref 3.5–5.1)
Sodium: 142 mEq/L (ref 135–145)
TOTAL PROTEIN: 6.8 g/dL (ref 6.0–8.3)

## 2018-09-09 LAB — HEPATIC FUNCTION PANEL
ALT: 35 U/L (ref 0–53)
AST: 13 U/L (ref 0–37)
Albumin: 4.4 g/dL (ref 3.5–5.2)
Alkaline Phosphatase: 63 U/L (ref 39–117)
BILIRUBIN DIRECT: 0.1 mg/dL (ref 0.0–0.3)
BILIRUBIN TOTAL: 0.6 mg/dL (ref 0.2–1.2)
TOTAL PROTEIN: 6.8 g/dL (ref 6.0–8.3)

## 2018-09-09 LAB — LIPID PANEL
CHOL/HDL RATIO: 4
Cholesterol: 172 mg/dL (ref 0–200)
HDL: 38.5 mg/dL — AB (ref >39.00)
LDL Cholesterol: 109 mg/dL — ABNORMAL HIGH (ref 0–99)
NonHDL: 133.82
TRIGLYCERIDES: 123 mg/dL (ref 0.0–149.0)
VLDL: 24.6 mg/dL (ref 0.0–40.0)

## 2018-09-09 LAB — TSH: TSH: 2.31 u[IU]/mL (ref 0.35–4.50)

## 2018-09-09 LAB — PSA: PSA: 0.85 ng/mL (ref 0.10–4.00)

## 2018-09-09 LAB — HEMOGLOBIN A1C: HEMOGLOBIN A1C: 5.6 % (ref 4.6–6.5)

## 2018-09-09 MED ORDER — AMLODIPINE BESYLATE 5 MG PO TABS: 5.0000 mg | ORAL_TABLET | Freq: Every day | ORAL | 0 refills | Status: DC

## 2018-09-09 NOTE — Patient Instructions (Signed)
It was great seeing you today   I am going to change your blood pressure medication from Lisinopril to Norvasc. Please follow up in 2 weeks   Try taking Omeprazole 20 mg daily for acid reflux. Take this for two weeks   We will follow up with you regarding your blood work   Please work on weight loss through diet and exercise

## 2018-09-09 NOTE — Progress Notes (Signed)
Subjective:    Patient ID: Raymond Mann, male    DOB: Jan 22, 1956, 62 y.o.   MRN: [id number]  HPI Patient presents for yearly preventative medicine examination. He is a pleasant 62 year old male who  has a past medical history of Cataract of right eye, ED (erectile dysfunction), Essential hypertension, Hyperlipidemia, Myopia, Ocular disease, Pseudophakia of left eye, Retinal detachment, Shingles, and Sight deterioration.  Essential Hypertension - lisinopril 20 mg daily - stable. He does report a dry cough  BP Readings from Last 3 Encounters:  09/09/18 116/90  09/01/18 122/76  04/30/18 110/76   Hyperlipidemia - Takes pravastatin 20 mg daily.   All immunizations and health maintenance protocols were reviewed with the patient and needed orders were placed. utd   Appropriate screening laboratory values were ordered for the patient including screening of hyperlipidemia, renal function and hepatic function. If indicated by BPH, a PSA was ordered.  Medication reconciliation,  past medical history, social history, problem list and allergies were reviewed in detail with the patient  Goals were established with regard to weight loss, exercise, and  diet in compliance with medications. He tries to eat healthy but does not exercise on a routine basis.  Wt Readings from Last 3 Encounters:  09/09/18 235 lb (106.6 kg)  09/01/18 235 lb (106.6 kg)  04/30/18 232 lb (105.2 kg)   His biggest complaint is that of GERD. He was seen in the ER a few weeks ago because he thought he was having a heart attack. Exam was negative. He has been taking antacids and reports that this helps.   Review of Systems  Constitutional: Negative.   HENT: Negative.   Eyes: Negative.   Respiratory: Negative.   Cardiovascular: Negative.   Gastrointestinal: Positive for abdominal pain.  Endocrine: Negative.   Genitourinary: Negative.   Musculoskeletal: Negative.   Skin: Negative.   Allergic/Immunologic: Negative.     Neurological: Negative.   Hematological: Negative.   Psychiatric/Behavioral: Negative.   All other systems reviewed and are negative.  Past Medical History:  Diagnosis Date  . Cataract of right eye   . ED (erectile dysfunction)   . Essential hypertension   . Hyperlipidemia   . Myopia   . Ocular disease   . Pseudophakia of left eye   . Retinal detachment    both eyes  . Shingles   . Sight deterioration    right eye    Social History   Socioeconomic History  . Marital status: Single    Spouse name: Not on file  . Number of children: Not on file  . Years of education: Not on file  . Highest education level: Not on file  Occupational History  . Not on file  Social Needs  . Financial resource strain: Not on file  . Food insecurity:    Worry: Not on file    Inability: Not on file  . Transportation needs:    Medical: Not on file    Non-medical: Not on file  Tobacco Use  . Smoking status: Never Smoker  . Smokeless tobacco: Never Used  Substance and Sexual Activity  . Alcohol use: Yes    Comment: occasionally  . Drug use: No  . Sexual activity: Not on file  Lifestyle  . Physical activity:    Days per week: Not on file    Minutes per session: Not on file  . Stress: Not on file  Relationships  . Social connections:    Talks on phone: Not  on file    Gets together: Not on file    Attends religious service: Not on file    Active member of club or organization: Not on file    Attends meetings of clubs or organizations: Not on file    Relationship status: Not on file  . Intimate partner violence:    Fear of current or ex partner: Not on file    Emotionally abused: Not on file    Physically abused: Not on file    Forced sexual activity: Not on file  Other Topics Concern  . Not on file  Social History 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    One grandchild        Past Surgical History:  Procedure Laterality Date  .  bilateral retinal detachment     x2 each eye  . CAPSULOTOMY  2001   left eye  . cryopexy  1995   right eye  . scleral buckling  1995   left eye  . VITRECTOMY  1995   both eyes    Family History  Problem Relation Age of Onset  . Stroke Father   . Heart attack Father   . Dementia Mother   . Stomach cancer Mother 44  . Retinal detachment Brother     Allergies  Allergen Reactions  . Bee Pollen Anaphylaxis    Current Outpatient Medications on File Prior to Visit  Medication Sig Dispense Refill  . ibuprofen (ADVIL,MOTRIN) 200 MG tablet Take 400 mg by mouth as needed for moderate pain.    . pravastatin (PRAVACHOL) 20 MG tablet Take 20 mg by mouth daily.      No current facility-administered medications on file prior to visit.     BP 116/90   Temp 98.3 F (36.8 C)   Ht 5\' 11"  (1.803 m)   Wt 235 lb (106.6 kg)   BMI 32.78 kg/m       Objective:   Physical Exam  Constitutional: He is oriented to person, place, and time. He appears well-developed and well-nourished. No distress.  Obese   HENT:  Head: Normocephalic and atraumatic.  Right Ear: External ear normal.  Left Ear: External ear normal.  Nose: Nose normal.  Mouth/Throat: Oropharynx is clear and moist. No oropharyngeal exudate.  Eyes: Pupils are equal, round, and reactive to light. Conjunctivae and EOM are normal. Right eye exhibits no discharge. Left eye exhibits no discharge. No scleral icterus.  Neck: Normal range of motion. Neck supple. No JVD present. No tracheal deviation present. No thyromegaly present.  Cardiovascular: Normal rate, regular rhythm, normal heart sounds and intact distal pulses. Exam reveals no gallop and no friction rub.  No murmur heard. Pulmonary/Chest: Effort normal and breath sounds normal. No stridor. No respiratory distress. He has no wheezes. He has no rales. He exhibits no tenderness.  Abdominal: Soft. Bowel sounds are normal. He exhibits no distension and no mass. There is  tenderness in the epigastric area. There is no rebound and no guarding. No hernia.  Musculoskeletal: Normal range of motion. He exhibits no edema, tenderness or deformity.  Lymphadenopathy:    He has no cervical adenopathy.  Neurological: He is alert and oriented to person, place, and time. He displays normal reflexes. No cranial nerve deficit or sensory deficit. He exhibits normal muscle tone. Coordination normal.  Skin: Skin is warm and dry. Capillary refill takes less than 2 seconds. No rash noted. He is not  diaphoretic. No erythema. No pallor.  Psychiatric: He has a normal mood and affect. His behavior is normal. Judgment and thought content normal.  Nursing note and vitals reviewed.     Assessment & Plan:  1. Routine general medical examination at a health care facility - Encouraged lifestyle modification  - Follow up in one year or sooner if needed - CBC with Differential/Platelet - Comprehensive metabolic panel - Hepatic function panel - Lipid panel - TSH - POCT Urinalysis Dipstick (Automated)  2. Mixed hyperlipidemia - Consider increase in statin  - CBC with Differential/Platelet - Comprehensive metabolic panel - Hepatic function panel - Lipid panel - TSH  3. Essential hypertension - Will switch from Lisinopril to Norvasc dut to dry cough - - Follow up in 2 weeks and monitor BP at home, bring log to next visit  - CBC with Differential/Platelet - Comprehensive metabolic panel - Hepatic function panel - Lipid panel - TSH - amLODipine (NORVASC) 5 MG tablet; Take 1 tablet (5 mg total) by mouth daily.  Dispense: 90 tablet; Refill: 0  4. Prostate cancer screening  - PSA  5. Need for hepatitis C screening test  - Hep C Antibody  6. Encounter for screening for HIV  - HIV Antibody (routine testing w rflx)  7. Gastroesophageal reflux disease with esophagitis - Trial omeprazole 20 mg OTC for 2 weeks   Shirline Freesory Natsha Guidry, NP

## 2018-09-10 LAB — HEPATITIS C ANTIBODY
HEP C AB: NONREACTIVE
SIGNAL TO CUT-OFF: 0.01 (ref <1.00)

## 2018-09-10 LAB — HIV ANTIBODY (ROUTINE TESTING W REFLEX): HIV 1&2 Ab, 4th Generation: NONREACTIVE

## 2018-09-12 ENCOUNTER — Ambulatory Visit: Payer: Self-pay

## 2018-09-12 NOTE — Telephone Encounter (Signed)
Attempted to call pt. to give results of PSA.  Per pt's previous request, left results on voice mail of normal results.  Advised to call office with any questions.   Reason for Disposition . Health Information question, no triage required and triager able to answer question  Answer Assessment - Initial Assessment Questions 1. REASON FOR CALL or QUESTION: "What is your reason for calling today?" or "How can I best help you?" or "What question do you have that I can help answer?"     Per phone message, requested results of PSA; requested to leave a voice message with results.  Protocols used: INFORMATION ONLY CALL-A-AH

## 2018-09-23 ENCOUNTER — Encounter: Payer: Self-pay | Admitting: Adult Health

## 2018-09-23 ENCOUNTER — Ambulatory Visit (INDEPENDENT_AMBULATORY_CARE_PROVIDER_SITE_OTHER): Payer: No Typology Code available for payment source | Admitting: Adult Health

## 2018-09-23 VITALS — BP 100/82 | Temp 98.6°F | Wt 231.0 lb

## 2018-09-23 DIAGNOSIS — I10 Essential (primary) hypertension: Secondary | ICD-10-CM | POA: Diagnosis not present

## 2018-09-23 NOTE — Addendum Note (Signed)
Addended by: Nancy FetterNAFZIGER, Darleene Cumpian L on: 09/23/2018 11:18 AM   Modules accepted: Level of Service

## 2018-09-23 NOTE — Progress Notes (Signed)
Subjective:    Patient ID: Raymond Mann, male    DOB: 16-Apr-1956, 62 y.o.   MRN: [id number]  HPI   62 year old male who  has a past medical history of Cataract of right eye, ED (erectile dysfunction), Essential hypertension, Fatty liver, Hyperlipidemia, Myopia, Ocular disease, Pseudophakia of left eye, Retinal detachment, Shingles, and Sight deterioration.  He presents to the office today for two week follow up regarding hypertension. During his last visit he was switched off lisinopril due to chornic dry cough. He was placed on Norvasc 5 mg.   Today in the office he reports that his cough has almost completely resolved. He denies lightheadedness, blurred vision, headaches, or lower extremity edema.   BP Readings from Last 3 Encounters:  09/23/18 100/82  09/09/18 116/90  09/01/18 122/76    Review of Systems See HPI   Past Medical History:  Diagnosis Date  . Cataract of right eye   . ED (erectile dysfunction)   . Essential hypertension   . Fatty liver   . Hyperlipidemia   . Myopia   . Ocular disease   . Pseudophakia of left eye   . Retinal detachment    both eyes  . Shingles   . Sight deterioration    right eye    Social History   Socioeconomic History  . Marital status: Single    Spouse name: Not on file  . Number of children: Not on file  . Years of education: Not on file  . Highest education level: Not on file  Occupational History  . Not on file  Social Needs  . Financial resource strain: Not on file  . Food insecurity:    Worry: Not on file    Inability: Not on file  . Transportation needs:    Medical: Not on file    Non-medical: Not on file  Tobacco Use  . Smoking status: Never Smoker  . Smokeless tobacco: Never Used  Substance and Sexual Activity  . Alcohol use: Yes    Comment: occasionally  . Drug use: No  . Sexual activity: Not on file  Lifestyle  . Physical activity:    Days per week: Not on file    Minutes per session: Not on file  .  Stress: Not on file  Relationships  . Social connections:    Talks on phone: Not on file    Gets together: Not on file    Attends religious service: Not on file    Active member of club or organization: Not on file    Attends meetings of clubs or organizations: Not on file    Relationship status: Not on file  . Intimate partner violence:    Fear of current or ex partner: Not on file    Emotionally abused: Not on file    Physically abused: Not on file    Forced sexual activity: Not on file  Other Topics Concern  . Not on file  Social History 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    One grandchild        Past Surgical History:  Procedure Laterality Date  . bilateral retinal detachment     x2 each eye  . CAPSULOTOMY  2001   left eye  . cryopexy  1995   right eye  . scleral buckling  1995   left eye  . VITRECTOMY  1995   both eyes  Family History  Problem Relation Age of Onset  . Stroke Father   . Heart attack Father   . Dementia Mother   . Stomach cancer Mother [id number]  . Retinal detachment Brother     Allergies  Allergen Reactions  . Bee Pollen Anaphylaxis    Current Outpatient Medications on File Prior to Visit  Medication Sig Dispense Refill  . amLODipine (NORVASC) 5 MG tablet Take 1 tablet (5 mg total) by mouth daily. 90 tablet 0  . pravastatin (PRAVACHOL) 20 MG tablet Take 20 mg by mouth daily.      No current facility-administered medications on file prior to visit.     BP 100/82   Temp 98.6 F (37 C)   Wt 231 lb (104.8 kg)   BMI 32.22 kg/m       Objective:   Physical Exam Vitals signs and nursing note reviewed.  Constitutional:      Appearance: Normal appearance.  Cardiovascular:     Rate and Rhythm: Normal rate and regular rhythm.     Pulses: Normal pulses.     Heart sounds: Normal heart sounds.  Pulmonary:     Effort: Pulmonary effort is normal.     Breath sounds: Normal breath sounds.    Musculoskeletal:        General: No swelling.     Right lower leg: No edema.     Left lower leg: No edema.  Neurological:     General: No focal deficit present.     Mental Status: He is alert and oriented to person, place, and time.  Psychiatric:        Mood and Affect: Mood normal.        Behavior: Behavior normal.        Thought Content: Thought content normal.        Judgment: Judgment normal.       Assessment & Plan:  Essential hypertension - Well controlled. No change in medications  - Follow up as needed  Shirline Freesory Angelize Ryce, NP

## 2018-10-09 ENCOUNTER — Other Ambulatory Visit: Payer: Self-pay | Admitting: Family Medicine

## 2018-10-09 MED ORDER — PRAVASTATIN SODIUM 20 MG PO TABS: 20.0000 mg | ORAL_TABLET | Freq: Every day | ORAL | 3 refills | Status: DC

## 2018-12-03 ENCOUNTER — Other Ambulatory Visit: Payer: Self-pay | Admitting: Adult Health

## 2018-12-03 DIAGNOSIS — I10 Essential (primary) hypertension: Secondary | ICD-10-CM

## 2018-12-04 NOTE — Telephone Encounter (Signed)
Sent to the pharmacy by e-scribe. 

## 2018-12-13 IMAGING — DX DG CHEST 1V PORT
1 series · 1 of 1 positions shown · non-contrast
Comparison: PA and lateral chest 06/25/2009.

CLINICAL DATA: Chest pain today.

EXAM:
PORTABLE CHEST 1 VIEW

[chest]
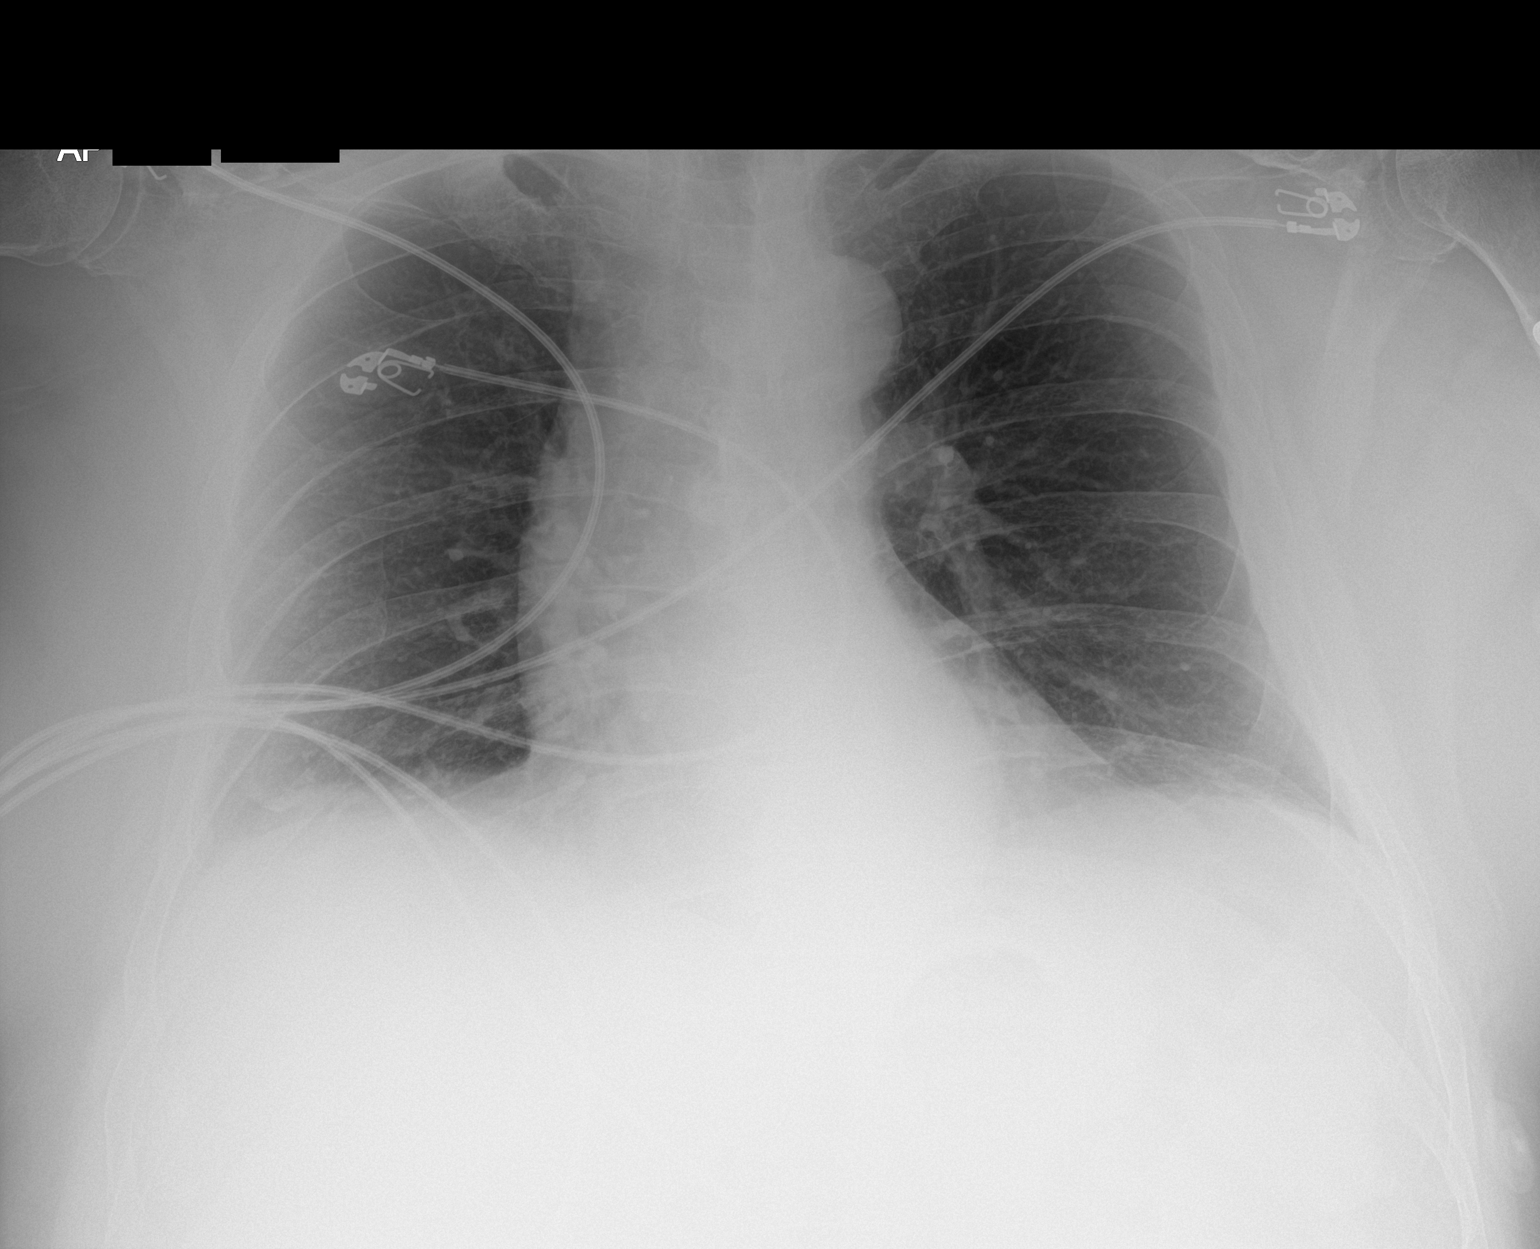

[1 of 1 positions shown; findings below may reference images not displayed]

FINDINGS: Lung volumes are somewhat low but the lungs are clear. Heart size is
normal. No pneumothorax or pleural effusion. No acute or focal bony
abnormality.
IMPRESSION: No acute disease.

## 2018-12-13 IMAGING — CT CT ANGIO CHEST-ABD-PELV FOR DISSECTION W/ AND WO/W CM
2 of 7 series · 14 of 46 positions shown, 16 images · IV contrast (OMNI 350)
Comparison: CT scan of June 23, 2009.

CLINICAL DATA: Chest and upper abdominal pain.

EXAM:
CT ANGIOGRAPHY CHEST, ABDOMEN AND PELVIS
TECHNIQUE: Multidetector CT imaging through the chest, abdomen and pelvis was
performed using the standard protocol during bolus administration of
intravenous contrast. Multiplanar reconstructed images and MIPs were
obtained and reviewed to evaluate the vascular anatomy.
CONTRAST:  100mL [id number]-TVQ IOPAMIDOL (PVO3JB-TVQ) INJECTION 76%

[Series 7: dissection 2mm · axial · 0.98mm/px · z∈[-424,+190]mm · 11 of 343 slices shown, 13 images]
[im 18/343  soft-tissue]
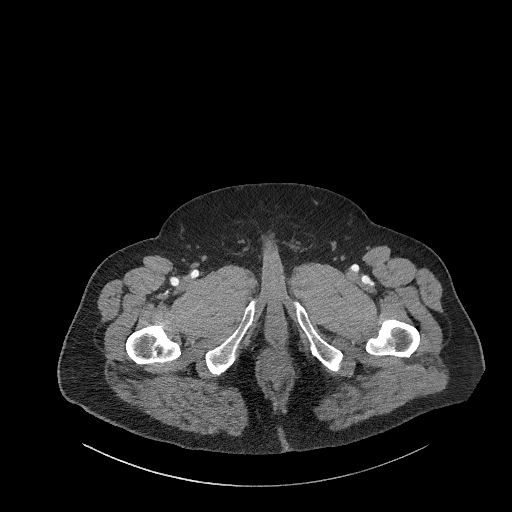
[im 18/343  bone]
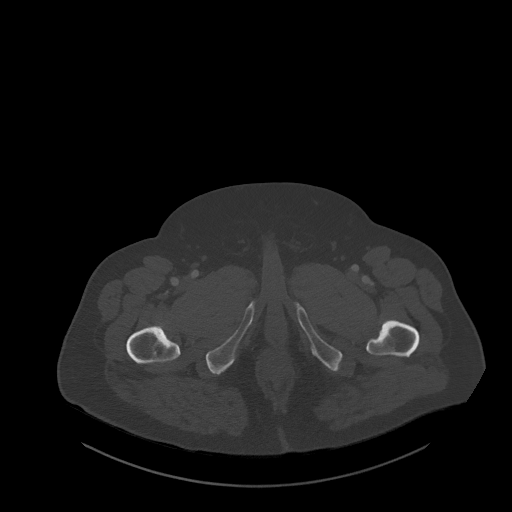
[im 52/343  soft-tissue]
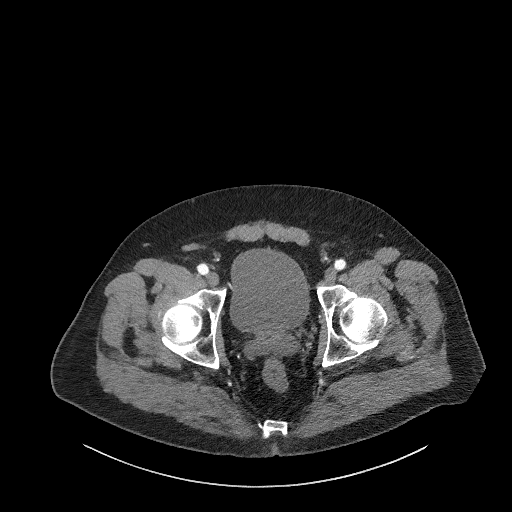
[im 86/343  soft-tissue]
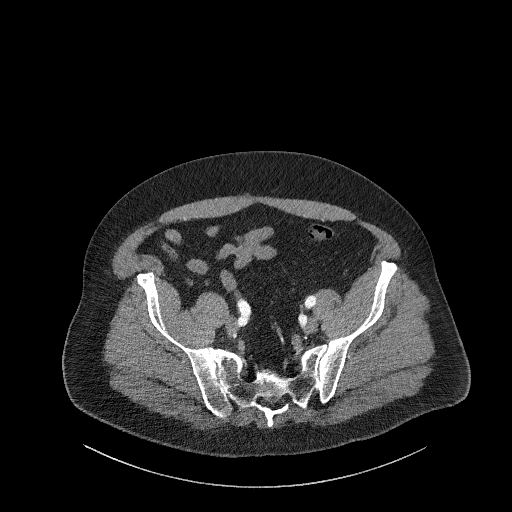
[im 120/343  soft-tissue]
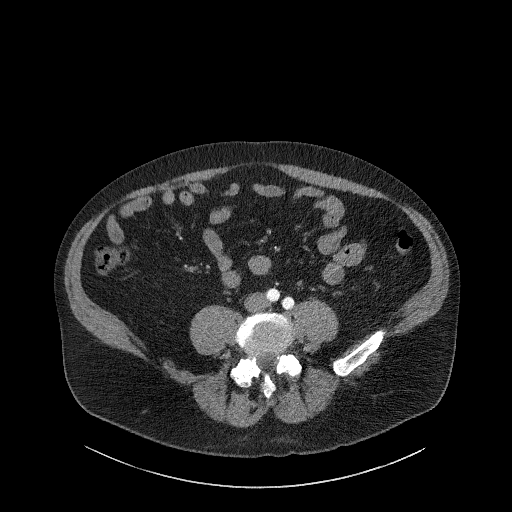
[im 137/343  soft-tissue]
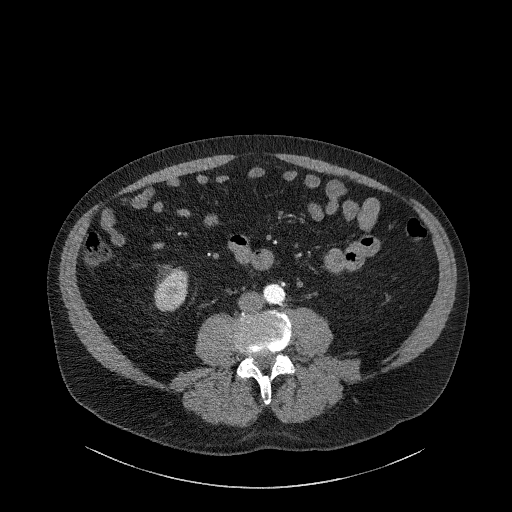
[im 172/343  soft-tissue]
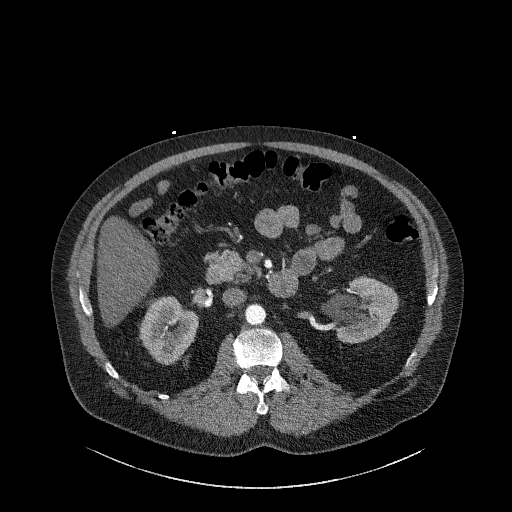
[im 206/343  soft-tissue]
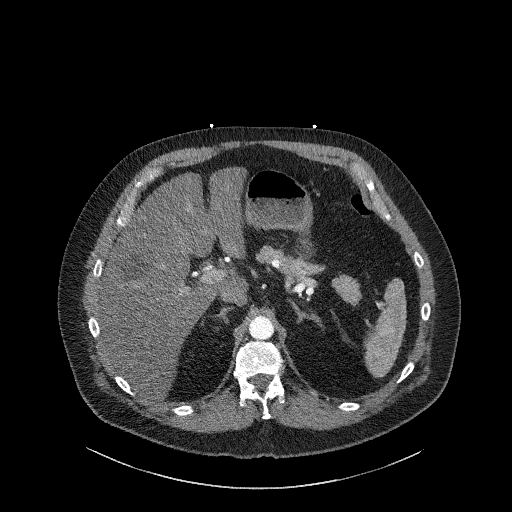
[im 223/343  soft-tissue]
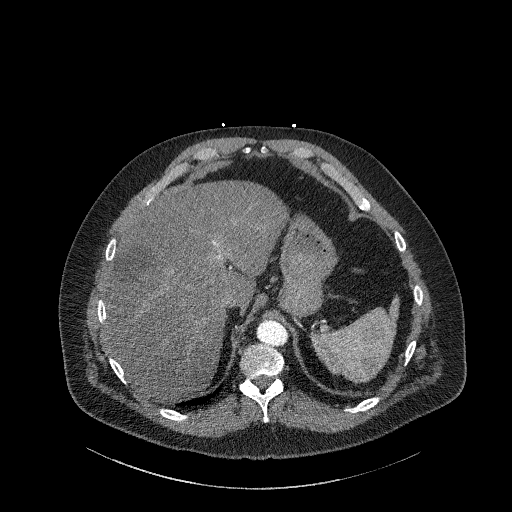
[im 257/343  soft-tissue]
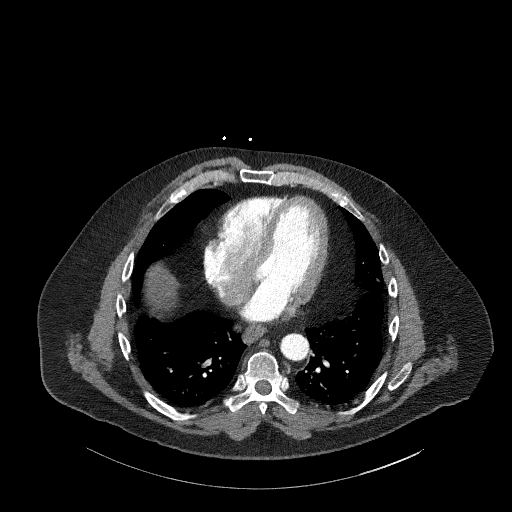
[im 257/343  bone]
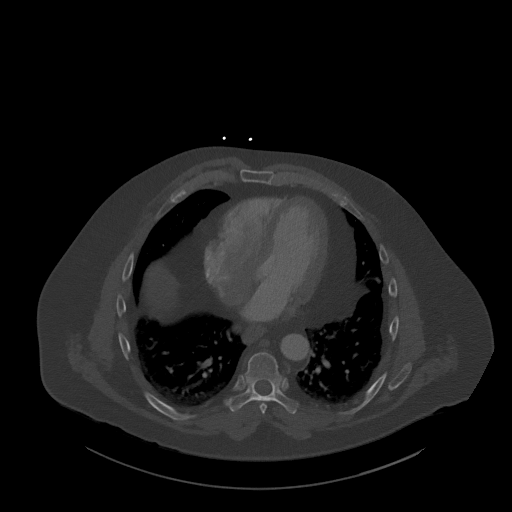
[im 291/343  soft-tissue]
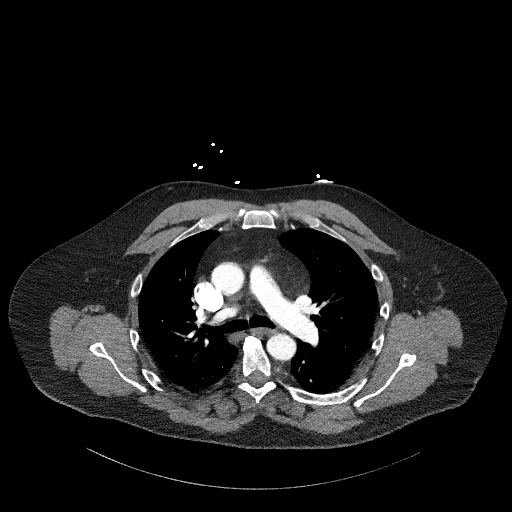
[im 325/343  soft-tissue]
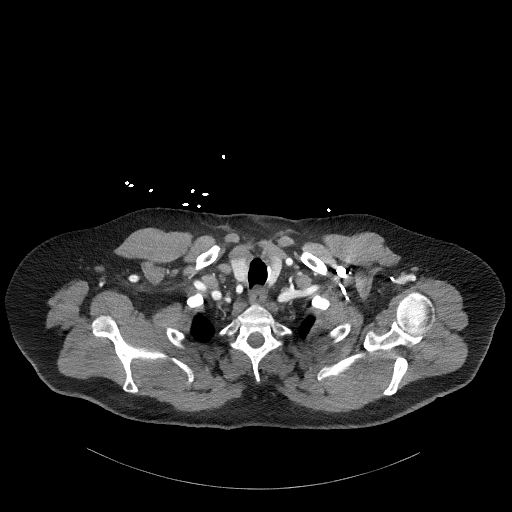

[Series 10: dissection 2mm cor · coronal · 0.89mm/px · 3 of 188 slices shown]
[im 47/188  soft-tissue]
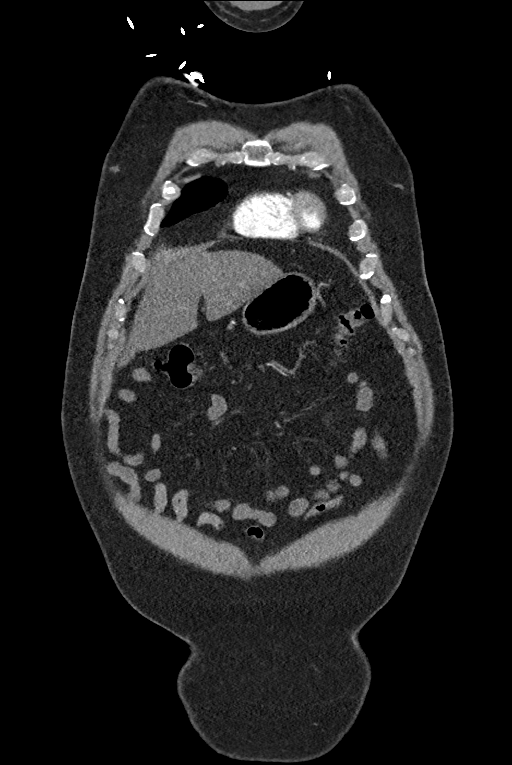
[im 94/188  soft-tissue]
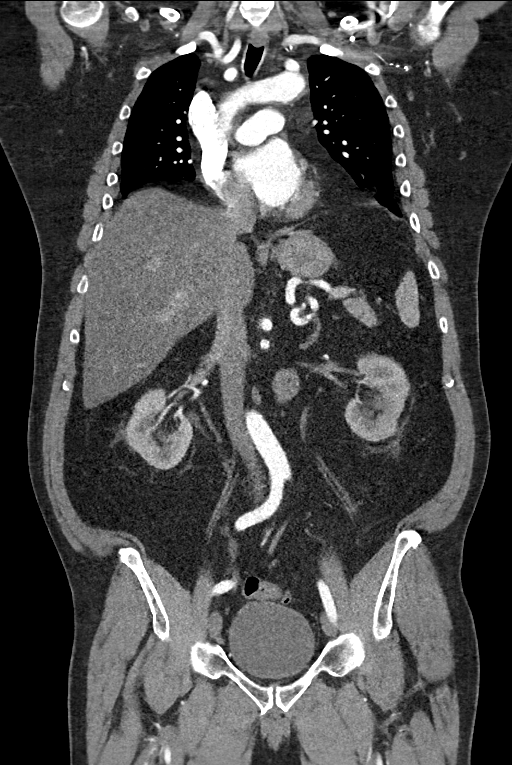
[im 141/188  soft-tissue]
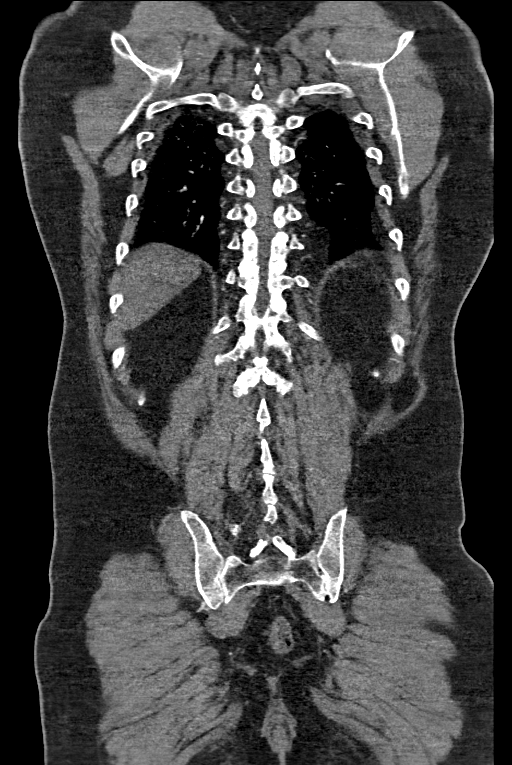

[14 of 46 positions shown; findings below may reference images not displayed]

FINDINGS: CTA CHEST FINDINGS

Cardiovascular: Satisfactory opacification of the pulmonary arteries
to the segmental level. No evidence of pulmonary embolism. Normal
heart size. No pericardial effusion.

Mediastinum/Nodes: No enlarged mediastinal, hilar, or axillary lymph
nodes. Thyroid gland, trachea, and esophagus demonstrate no
significant findings.

Lungs/Pleura: No pneumothorax is noted. Minimal bilateral pleural
effusions are noted. No consolidative process is noted.

Musculoskeletal: No chest wall abnormality. No acute or significant
osseous findings.

Review of the MIP images confirms the above findings.

CTA ABDOMEN AND PELVIS FINDINGS

VASCULAR

Aorta: Normal caliber aorta without aneurysm, dissection, vasculitis
or significant stenosis.

Celiac: Patent without evidence of aneurysm, dissection, vasculitis
or significant stenosis.

SMA: Patent without evidence of aneurysm, dissection, vasculitis or
significant stenosis.

Renals: Both renal arteries are patent without evidence of aneurysm,
dissection, vasculitis, fibromuscular dysplasia or significant
stenosis.

IMA: Patent without evidence of aneurysm, dissection, vasculitis or
significant stenosis.

Inflow: Patent without evidence of aneurysm, dissection, vasculitis
or significant stenosis.

Veins: No obvious venous abnormality within the limitations of this
arterial phase study.

Review of the MIP images confirms the above findings.

NON-VASCULAR

Hepatobiliary: No gallstones or biliary dilatation is noted. Fatty
infiltration of the liver is noted. 5.4 cm cyst is seen in right
hepatic lobe.

Pancreas: Unremarkable. No pancreatic ductal dilatation or
surrounding inflammatory changes.

Spleen: Normal in size without focal abnormality.

Adrenals/Urinary Tract: Stable right adrenal fat containing adenoma
or myelolipoma. Left adrenal gland is unremarkable. No
hydronephrosis or renal obstruction is noted. No renal or ureteral
calculi are noted. Urinary bladder is unremarkable.

Stomach/Bowel: Stomach is within normal limits. Appendix appears
normal. No evidence of bowel wall thickening, distention, or
inflammatory changes.

Lymphatic: No significant vascular findings are present. No enlarged
abdominal or pelvic lymph nodes.

Reproductive: Prostate is unremarkable.

Other: No abdominal wall hernia or abnormality. No abdominopelvic
ascites.

Musculoskeletal: No acute or significant osseous findings.

Review of the MIP images confirms the above findings.
IMPRESSION: No evidence of thoracic or abdominal aortic aneurysm or dissection.

No evidence of significant mesenteric or renal artery stenosis.

Fatty infiltration of the liver.

Probable right adrenal myelolipoma.

Right hepatic cyst.

No acute abnormality seen in the abdomen or pelvis.

## 2019-01-28 ENCOUNTER — Telehealth: Payer: Self-pay | Admitting: Adult Health

## 2019-01-28 ENCOUNTER — Other Ambulatory Visit: Payer: Self-pay

## 2019-01-28 ENCOUNTER — Encounter: Payer: Self-pay | Admitting: Adult Health

## 2019-01-28 ENCOUNTER — Ambulatory Visit (INDEPENDENT_AMBULATORY_CARE_PROVIDER_SITE_OTHER): Payer: No Typology Code available for payment source | Admitting: Adult Health

## 2019-01-28 DIAGNOSIS — N529 Male erectile dysfunction, unspecified: Secondary | ICD-10-CM

## 2019-01-28 DIAGNOSIS — L259 Unspecified contact dermatitis, unspecified cause: Secondary | ICD-10-CM | POA: Diagnosis not present

## 2019-01-28 MED ORDER — SILDENAFIL CITRATE 20 MG PO TABS: ORAL_TABLET | ORAL | 2 refills | Status: DC

## 2019-01-28 MED ORDER — PREDNISONE 20 MG PO TABS: 20.0000 mg | ORAL_TABLET | Freq: Every day | ORAL | 0 refills | Status: DC

## 2019-01-28 NOTE — Telephone Encounter (Signed)
Spoke to the pt.  He is now scheduled to see Kandee Keen at 3 PM today.  Nothing further needed.

## 2019-01-28 NOTE — Telephone Encounter (Signed)
Copied from CRM [id number]. Topic: Quick Communication - Rx Refill/Question >> Jan 28, 2019 12:00 PM Gwenlyn Fudge A wrote: Medication: Prednisone  Has the patient contacted their pharmacy? No. Pt states he had a telemed appt and was prescribed medication for a contact rash. Pt states the medication worked but not the rash is back and he is requesting the medication again for a longer period of time. Pt is also requesting to speak with PCP assistant. Please advise.  (Agent: If no, request that the patient contact the pharmacy for the refill.) (Agent: If yes, when and what did the pharmacy advise?)  Preferred Pharmacy (with phone number or street name):[facility] [address], [place] - [address] [place] [address] Phone: [phone] Fax: [phone] Not a 24 hour pharmacy; exact hours not known.    Agent: Please be advised that RX refills may take up to 3 business days. We ask that you follow-up with your pharmacy.

## 2019-01-28 NOTE — Progress Notes (Signed)
Virtual Visit via Video Note  I connected with Raymond Mann  on 01/28/19 at  3:30 PM EDT by a video enabled telemedicine application and verified that I am speaking with the correct person using two identifiers.  Location patient: home Location provider:work or home office Persons participating in the virtual visit: patient, provider  I discussed the limitations of evaluation and management by telemedicine and the availability of in person appointments. The patient expressed understanding and agreed to proceed.   HPI:   63 year old male who is being evaluated today for rash.  He reports that he was seen by telemedicine that he gets free through work in the beginning of April and was diagnosed with contact dermatitis.  He was given 5 days of prednisone 20 mg tabs ports that the rash had resolved but once he stopped taking the prednisone the rash came back.  He denies any changes in soaps or detergents.  Endorses itchiness but no vesicles, lesions, or drainage.  Rash is located on the top of his head and right shoulder.  Addiotnally, he would like to have his generic Viagra refilled  ROS: See pertinent positives and negatives per HPI.  Past Medical History:  Diagnosis Date  . Cataract of right eye   . ED (erectile dysfunction)   . Essential hypertension   . Fatty liver   . Hyperlipidemia   . Myopia   . Ocular disease   . Pseudophakia of left eye   . Retinal detachment    both eyes  . Shingles   . Sight deterioration    right eye    Past Surgical History:  Procedure Laterality Date  . bilateral retinal detachment     x2 each eye  . CAPSULOTOMY  2001   left eye  . cryopexy  1995   right eye  . scleral buckling  1995   left eye  . VITRECTOMY  1995   both eyes    Family History  Problem Relation Age of Onset  . Stroke Father   . Heart attack Father   . Dementia Mother   . Stomach cancer Mother [phone]  . Retinal detachment Brother       Current Outpatient Medications:   .  amLODipine (NORVASC) 5 MG tablet, TAKE ONE TABLET BY MOUTH DAILY, Disp: 90 tablet, Rfl: 3 .  pravastatin (PRAVACHOL) 20 MG tablet, Take 1 tablet (20 mg total) by mouth daily., Disp: 90 tablet, Rfl: 3 .  predniSONE (DELTASONE) 20 MG tablet, Take 1 tablet (20 mg total) by mouth daily with breakfast., Disp: 10 tablet, Rfl: 0 .  sildenafil (REVATIO) 20 MG tablet, Take 1-5 tablets PRN, Disp: 90 tablet, Rfl: 2  EXAM:  VITALS per patient if applicable:  GENERAL: alert, oriented, appears well and in no acute distress  HEENT: atraumatic, conjunttiva clear, no obvious abnormalities on inspection of external nose and ears  NECK: normal movements of the head and neck  LUNGS: on inspection no signs of respiratory distress, breathing rate appears normal, no obvious gross SOB, gasping or wheezing  CV: no obvious cyanosis  MS: moves all visible extremities without noticeable abnormality  PSYCH/NEURO: pleasant and cooperative, no obvious depression or anxiety, speech and thought processing grossly intact  SKIN: Raised red rash on right shoulder and on top of the head..  No vesicles noted no drainage noted  ASSESSMENT AND PLAN:  Discussed the following assessment and plan:  Contact dermatitis, unspecified contact dermatitis type, unspecified trigger - Plan: predniSONE (DELTASONE) 20 MG tablet  Erectile dysfunction, unspecified erectile dysfunction type - Plan: sildenafil (REVATIO) 20 MG tablet   I discussed the assessment and treatment plan with the patient. The patient was provided an opportunity to ask questions and all were answered. The patient agreed with the plan and demonstrated an understanding of the instructions.   The patient was advised to call back or seek an in-person evaluation if the symptoms worsen or if the condition fails to improve as anticipated.   [name], NP

## 2019-02-18 ENCOUNTER — Ambulatory Visit: Payer: Self-pay

## 2019-02-18 NOTE — Telephone Encounter (Signed)
Returned call to patient who states that he has a rash and decided to call his dermatologist for his problem. Pt denies need for our services at this time.  Reason for Disposition . General information question, no triage required and triager able to answer question  Answer Assessment - Initial Assessment Questions 1. REASON FOR CALL or QUESTION: "What is your reason for calling today?" or "How can I best help you?" or "What question do you have that I can help answer?"     Pt states he has called a dermatologist for his problem  Protocols used: INFORMATION ONLY CALL-A-AH

## 2019-04-30 ENCOUNTER — Ambulatory Visit: Payer: Self-pay | Admitting: *Deleted

## 2019-04-30 NOTE — Telephone Encounter (Signed)
Patient is calling to report he having bilateral ankle swelling and top of feet. Patient has had to switch jobs and he has noticed that his ankles/ and tops of feet are swelling. Patient is sitting at job now. Patient is taking Amlodipine 5 mg once daily. Patient states he does not have swelling in the morning- but it increases throughout the day. Patient wants to discuss what he can do to help that- if he needs to change medication. 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 Patient is off on Mondays and can take calls after 3:30. Patient states he can return a call on a break or lunch- but has limited time.  Reason for Disposition . [1] MILD swelling of both ankles (i.e., pedal edema) AND [2] new onset or worsening  Answer Assessment - Initial Assessment Questions 1. LOCATION: "Which joint is swollen?"     Bilateral ankle swelling 2. ONSET: "When did the swelling start?"     1 week ago- started job 3 weeks ago 3. SIZE: "How large is the swelling?"     Hides ankle by the end of day 4. PAIN: "Is there any pain?" If so, ask: "How bad is it?" (Scale 1-10; or mild, moderate, severe)     No pain- cold feet 5. CAUSE: "What do you think caused the swollen joint?"     Change in activity 6. OTHER SYMPTOMS: "Do you have any other symptoms?" (e.g., fever, chest pain, difficulty breathing, calf pain)     no 7. PREGNANCY: "Is there any chance you are pregnant?" "When was your last menstrual period?"     n/a  Protocols used: LEG SWELLING AND EDEMA-A-AH, ANKLE SWELLING-A-AH

## 2019-05-01 ENCOUNTER — Other Ambulatory Visit: Payer: Self-pay

## 2019-05-01 ENCOUNTER — Encounter: Payer: Self-pay | Admitting: Adult Health

## 2019-05-01 ENCOUNTER — Ambulatory Visit (INDEPENDENT_AMBULATORY_CARE_PROVIDER_SITE_OTHER): Payer: BC Managed Care – PPO | Admitting: Adult Health

## 2019-05-01 DIAGNOSIS — R600 Localized edema: Secondary | ICD-10-CM | POA: Diagnosis not present

## 2019-05-01 NOTE — Telephone Encounter (Signed)
Spoke to the pt.  Scheduled for 3:30.  Available at 3:35.  Must sign out of work.  Working from home.  Will forward to [facility] as [facility].

## 2019-05-01 NOTE — Progress Notes (Signed)
Virtual Visit via Video Note  I connected with Lynn ItoPaul Kiss on 05/01/19 at  3:30 PM EDT by a video enabled telemedicine application and verified that I am speaking with the correct person using two identifiers.  Location patient: home Location provider:work or home office Persons participating in the virtual visit: patient, provider  I discussed the limitations of evaluation and management by telemedicine and the availability of in person appointments. The patient expressed understanding and agreed to proceed.   HPI: 63 year old male who is being evaluated today for an acute issue of lower extremity edema.  He reports that he first noticed bilateral lower extremity edema about 3 days ago.  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  He feels as though swelling is nonexistent in the morning when he gets out of bed but then as the day progresses edema gets worse  He denies high sodium diet and drinks plenty of water throughout the day.  He denies chest pain or shortness of breath.  He has not experienced any calf pain or tenderness  Swelling does not go past his ankles.  ROS: See pertinent positives and negatives per HPI.  Past Medical History:  Diagnosis Date  . Cataract of right eye   . ED (erectile dysfunction)   . Essential hypertension   . Fatty liver   . Hyperlipidemia   . Myopia   . Ocular disease   . Pseudophakia of left eye   . Retinal detachment    both eyes  . Shingles   . Sight deterioration    right eye    Past Surgical History:  Procedure Laterality Date  . bilateral retinal detachment     x2 each eye  . CAPSULOTOMY  2001   left eye  . cryopexy  1995   right eye  . scleral buckling  1995   left eye  . VITRECTOMY  1995   both eyes    Family History  Problem Relation Age of Onset  . Stroke Father   . Heart attack Father   . Dementia Mother   . Stomach cancer Mother [id number]  . Retinal detachment Brother       Current Outpatient Medications:  .  amLODipine (NORVASC) 5 MG tablet, TAKE ONE TABLET BY MOUTH DAILY, Disp: 90 tablet, Rfl: 3 .  pravastatin (PRAVACHOL) 20 MG tablet, Take 1 tablet (20 mg total) by mouth daily., Disp: 90 tablet, Rfl: 3 .  predniSONE (DELTASONE) 20 MG tablet, Take 1 tablet (20 mg total) by mouth daily with breakfast., Disp: 10 tablet, Rfl: 0 .  sildenafil (REVATIO) 20 MG tablet, Take 1-5 tablets PRN, Disp: 90 tablet, Rfl: 2  EXAM:  VITALS per patient if applicable:  GENERAL: alert, oriented, appears well and in no acute distress  HEENT: atraumatic, conjunttiva clear, no obvious abnormalities on inspection of external nose and ears  NECK: normal movements of the head and neck  LUNGS: on inspection no signs of respiratory distress, breathing rate appears normal, no obvious gross SOB, gasping or wheezing  CV: no obvious cyanosis.  Mild nonpitting edema noticed across the dorsal aspect of bilateral feet as well as around the ankles.  MS: moves all visible extremities without noticeable abnormality  PSYCH/NEURO: pleasant and cooperative, no obvious depression or anxiety, speech and thought processing grossly intact  ASSESSMENT AND PLAN:  Discussed the following assessment and plan:  1. Bilateral lower extremity edema -He does use Norvasc for blood  pressure control but I do not think this is the causative factor.  Likely due to sitting for long periods of time -Can wear compression socks while at work.  Also advised to continue with hydration and low-sodium diet.  Can get up and walk periodically throughout the day - Follow up as needed    I discussed the assessment and treatment plan with the patient. The patient was provided an opportunity to ask questions and all were answered. The patient agreed with the plan and demonstrated an understanding of the instructions.   The patient was advised to call back or seek an in-person evaluation if the symptoms worsen or  if the condition fails to improve as anticipated.   Dorothyann Peng, NP

## 2019-07-23 ENCOUNTER — Ambulatory Visit (INDEPENDENT_AMBULATORY_CARE_PROVIDER_SITE_OTHER): Payer: PPO | Admitting: Adult Health

## 2019-07-23 ENCOUNTER — Other Ambulatory Visit: Payer: Self-pay | Admitting: Adult Health

## 2019-07-23 ENCOUNTER — Encounter: Payer: Self-pay | Admitting: Adult Health

## 2019-07-23 ENCOUNTER — Other Ambulatory Visit: Payer: Self-pay

## 2019-07-23 VITALS — BP 114/82 | Temp 97.3°F | Ht 71.0 in | Wt 240.0 lb

## 2019-07-23 DIAGNOSIS — I10 Essential (primary) hypertension: Secondary | ICD-10-CM

## 2019-07-23 DIAGNOSIS — Z23 Encounter for immunization: Secondary | ICD-10-CM

## 2019-07-23 DIAGNOSIS — Z0000 Encounter for general adult medical examination without abnormal findings: Secondary | ICD-10-CM

## 2019-07-23 DIAGNOSIS — E782 Mixed hyperlipidemia: Secondary | ICD-10-CM | POA: Diagnosis not present

## 2019-07-23 MED ORDER — TRIAMCINOLONE ACETONIDE 0.1 % EX CREA: 1.0000 "application " | TOPICAL_CREAM | Freq: Two times a day (BID) | CUTANEOUS | 2 refills | Status: DC

## 2019-07-23 NOTE — Addendum Note (Signed)
Addended by: Miles Costain T on: 07/23/2019 03:21 PM   Modules accepted: Orders

## 2019-07-23 NOTE — Patient Instructions (Signed)
It was great seeing you today   Please stop at the front desk and make an appointment to have labs drawn on Monday - make sure you are fasting   Since we are so close to your physical - I am going to use this visit at your physical. This means you will be do next year after October 15 for your next physical   Work on weight loss through diet and exercise

## 2019-07-23 NOTE — Progress Notes (Addendum)
Subjective:  Patient presents today for their annual wellness visit.    Preventive Screening-Counseling & Management  Smoking Status: Never Smoker Second Hand Smoking status: No smokers in home  Risk Factors Regular exercise: Does not exercise on a routine basis.  Diet: Tries to eat healthy  Fall Risk: None   Cardiac risk factors:  advanced age (older than 5455 for men, 765 for women)  Hyperlipidemia - takes Pravastatin Hypertension - controlled with Norvasc No diabetes Family History: Father had a history of stroke and MI  Depression Screen None. PHQ2 0   Activities of Daily Living Independent ADLs and IADLs   Hearing Difficulties: patient declines   Cognitive Testing No reported trouble.   Normal 3 word recall  List the Names of Other Physician/Practitioners you currently use: 1.Dentist: Dr. Marijo ConceptionSpong  2. Optho - [facility]  3. Dermatologist   Immunization History  Administered Date(s) Administered  . Influenza, Quadrivalent, Recombinant, Inj, Pf 07/26/2018  . Influenza,inj,Quad PF,6+ Mos 08/10/2017  . Tdap 04/02/2014  . Zoster Recombinat (Shingrix) 09/25/2017   Required Immunizations needed today: Prevnar 13  Screening tests- up to date Health Maintenance Due  Topic Date Due  . PNA vac Low Risk Adult (1 of 2 - PCV13) 07/08/2019    ROS- No pertinent positives discovered in course of AWV  The following were reviewed and entered/updated in epic: Past Medical History:  Diagnosis Date  . Cataract of right eye   . ED (erectile dysfunction)   . Essential hypertension   . Fatty liver   . Hyperlipidemia   . Myopia   . Ocular disease   . Pseudophakia of left eye   . Retinal detachment    both eyes  . Shingles   . Sight deterioration    right eye   Patient Active Problem List   Diagnosis Date Noted  . Hyperlipidemia   . Essential hypertension   . ED (erectile dysfunction)   . Chest pain 01/25/2012   Past Surgical History:   Procedure Laterality Date  . bilateral retinal detachment     x2 each eye  . CAPSULOTOMY  2001   left eye  . cryopexy  1995   right eye  . scleral buckling  1995   left eye  . VITRECTOMY  1995   both eyes    Family History  Problem Relation Age of Onset  . Stroke Father   . Heart attack Father   . Dementia Mother   . Stomach cancer Mother [id number]  . Retinal detachment Brother     Medications- reviewed and updated Current Outpatient Medications  Medication Sig Dispense Refill  . amLODipine (NORVASC) 5 MG tablet TAKE ONE TABLET BY MOUTH DAILY 90 tablet 3  . pravastatin (PRAVACHOL) 20 MG tablet Take 1 tablet (20 mg total) by mouth daily. 90 tablet 3  . sildenafil (REVATIO) 20 MG tablet Take 1-5 tablets PRN 90 tablet 2   No current facility-administered medications for this visit.     Allergies-reviewed and updated  Allergies  Allergen Reactions  . Bee Pollen Anaphylaxis    Social History   Socioeconomic History  . Marital status: Single    Spouse name: Not on file  . Number of children: Not on file  . Years of education: Not on file  . Highest education level: Not on file  Occupational History  . Not on file  Social Needs  . Financial resource strain: Not on file  . Food insecurity    Worry:  Not on file    Inability: Not on file  . Transportation needs    Medical: Not on file    Non-medical: Not on file  Tobacco Use  . Smoking status: Never Smoker  . Smokeless tobacco: Never Used  Substance and Sexual Activity  . Alcohol use: Yes    Comment: occasionally  . Drug use: No  . Sexual activity: Not on file  Lifestyle  . Physical activity    Days per week: Not on file    Minutes per session: Not on file  . Stress: Not on file  Relationships  . Social Musician on phone: Not on file    Gets together: Not on file    Attends religious service: Not on file    Active member of club or organization: Not on file    Attends meetings of clubs or  organizations: Not on file    Relationship status: Not on file  Other Topics Concern  . 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 Oropharynx normal Neck: no thyromegaly CV: RRR no murmurs rubs or gallops Lungs: CTAB no crackles, wheeze, rhonchi Abdomen: soft/nontender/nondistended/normal bowel sounds. No rebound or guarding.  Ext: no edema Skin: warm, dry Neuro: grossly normal, moves all extremities, PERRLA  Assessment/Plan: 1. Initial Medicare annual wellness visit - Needs to work on weight loss through diet and exercise - Follow up in one year or sooner if needed - CBC with Differential/Platelet; Future - Comprehensive metabolic panel; Future - Hemoglobin A1c; Future - Lipid panel; Future - PSA; Future - TSH; Future - EKG 12-Lead- NSR, rate 77  2. Essential hypertension - Continue with Norvasc  - CBC with Differential/Platelet; Future - Comprehensive metabolic panel; Future - Hemoglobin A1c; Future - Lipid panel; Future - PSA; Future - TSH; Future  3. Mixed hyperlipidemia - Continue with pravastatin  - CBC with Differential/Platelet; Future - Comprehensive metabolic panel; Future - Hemoglobin A1c; Future - Lipid panel; Future - PSA; Future - TSH; Future   No problem-specific Assessment & Plan notes found for this encounter.  Return precautions advised.   Orders Placed This Encounter  Procedures  . CBC with Differential/Platelet    Standing Status:   Future    Standing Expiration Date:   07/22/2020  . Comprehensive metabolic panel    Standing Status:   Future    Standing Expiration Date:   07/22/2020    Order Specific Question:   Has the patient fasted?    Answer:   Yes   . Hemoglobin A1c    Standing Status:   Future    Standing Expiration Date:   07/22/2020  . Lipid panel    Standing Status:   Future    Standing Expiration Date:   07/22/2020    Order Specific Question:   Has the patient fasted?    Answer:   Yes  . PSA    Standing Status:   Future    Standing Expiration Date:   07/22/2020  . TSH    Standing Status:   Future    Standing Expiration Date:  07/22/2020  . EKG 12-Lead    No orders of the defined types were placed in this encounter.   Dorothyann Peng, NP

## 2019-07-27 ENCOUNTER — Other Ambulatory Visit: Payer: Self-pay

## 2019-07-27 ENCOUNTER — Other Ambulatory Visit (INDEPENDENT_AMBULATORY_CARE_PROVIDER_SITE_OTHER): Payer: PPO

## 2019-07-27 DIAGNOSIS — Z0000 Encounter for general adult medical examination without abnormal findings: Secondary | ICD-10-CM | POA: Diagnosis not present

## 2019-07-27 DIAGNOSIS — E782 Mixed hyperlipidemia: Secondary | ICD-10-CM

## 2019-07-27 DIAGNOSIS — I10 Essential (primary) hypertension: Secondary | ICD-10-CM | POA: Diagnosis not present

## 2019-07-27 DIAGNOSIS — Z125 Encounter for screening for malignant neoplasm of prostate: Secondary | ICD-10-CM

## 2019-07-27 LAB — CBC WITH DIFFERENTIAL/PLATELET
Basophils Absolute: 0 10*3/uL (ref 0.0–0.1)
Basophils Relative: 1 % (ref 0.0–3.0)
Eosinophils Absolute: 0.1 10*3/uL (ref 0.0–0.7)
Eosinophils Relative: 2.1 % (ref 0.0–5.0)
HCT: 45.3 % (ref 39.0–52.0)
Hemoglobin: 15.3 g/dL (ref 13.0–17.0)
Lymphocytes Relative: 37.6 % (ref 12.0–46.0)
Lymphs Abs: 1.7 10*3/uL (ref 0.7–4.0)
MCHC: 33.8 g/dL (ref 30.0–36.0)
MCV: 91.8 fl (ref 78.0–100.0)
Monocytes Absolute: 0.3 10*3/uL (ref 0.1–1.0)
Monocytes Relative: 7.1 % (ref 3.0–12.0)
Neutro Abs: 2.4 10*3/uL (ref 1.4–7.7)
Neutrophils Relative %: 52.2 % (ref 43.0–77.0)
Platelets: 208 10*3/uL (ref 150.0–400.0)
RBC: 4.94 Mil/uL (ref 4.22–5.81)
RDW: 13 % (ref 11.5–15.5)
WBC: 4.6 10*3/uL (ref 4.0–10.5)

## 2019-07-27 LAB — TSH: TSH: 2.37 u[IU]/mL (ref 0.35–4.50)

## 2019-07-27 LAB — COMPREHENSIVE METABOLIC PANEL
ALT: 31 U/L (ref 0–53)
AST: 19 U/L (ref 0–37)
Albumin: 4.3 g/dL (ref 3.5–5.2)
Alkaline Phosphatase: 83 U/L (ref 39–117)
BUN: 12 mg/dL (ref 6–23)
CO2: 25 mEq/L (ref 19–32)
Calcium: 8.5 mg/dL (ref 8.4–10.5)
Chloride: 108 mEq/L (ref 96–112)
Creatinine, Ser: 0.95 mg/dL (ref 0.40–1.50)
GFR: 79.55 mL/min (ref >60.00)
Glucose, Bld: 109 mg/dL — ABNORMAL HIGH (ref 70–99)
Potassium: 4.2 mEq/L (ref 3.5–5.1)
Sodium: 142 mEq/L (ref 135–145)
Total Bilirubin: 0.5 mg/dL (ref 0.2–1.2)
Total Protein: 6.2 g/dL (ref 6.0–8.3)

## 2019-07-27 LAB — LIPID PANEL
Cholesterol: 141 mg/dL (ref 0–200)
HDL: 38.7 mg/dL — ABNORMAL LOW (ref >39.00)
LDL Cholesterol: 89 mg/dL (ref 0–99)
NonHDL: 102.53
Total CHOL/HDL Ratio: 4
Triglycerides: 67 mg/dL (ref 0.0–149.0)
VLDL: 13.4 mg/dL (ref 0.0–40.0)

## 2019-07-27 LAB — HEMOGLOBIN A1C: Hgb A1c MFr Bld: 5.8 % (ref 4.6–6.5)

## 2019-07-27 LAB — PSA: PSA: 0.94 ng/mL (ref 0.10–4.00)

## 2019-09-15 ENCOUNTER — Encounter: Payer: No Typology Code available for payment source | Admitting: Adult Health

## 2019-09-16 ENCOUNTER — Encounter: Payer: PPO | Admitting: Adult Health

## 2019-10-01 ENCOUNTER — Other Ambulatory Visit: Payer: Self-pay | Admitting: Adult Health

## 2019-10-01 NOTE — Telephone Encounter (Signed)
SENT TO THE PHARMACY BY E-SCRIBE. 

## 2019-12-03 ENCOUNTER — Other Ambulatory Visit: Payer: Self-pay | Admitting: Adult Health

## 2019-12-03 DIAGNOSIS — I10 Essential (primary) hypertension: Secondary | ICD-10-CM

## 2019-12-04 NOTE — Telephone Encounter (Signed)
Sent to the pharmacy by e-scribe. 

## 2020-03-03 DIAGNOSIS — L309 Dermatitis, unspecified: Secondary | ICD-10-CM | POA: Diagnosis not present

## 2020-03-03 DIAGNOSIS — B360 Pityriasis versicolor: Secondary | ICD-10-CM | POA: Diagnosis not present

## 2020-03-03 DIAGNOSIS — B078 Other viral warts: Secondary | ICD-10-CM | POA: Diagnosis not present

## 2020-03-03 DIAGNOSIS — L308 Other specified dermatitis: Secondary | ICD-10-CM | POA: Diagnosis not present

## 2020-05-18 ENCOUNTER — Encounter: Payer: Self-pay | Admitting: Adult Health

## 2020-05-18 ENCOUNTER — Other Ambulatory Visit: Payer: Self-pay

## 2020-05-18 ENCOUNTER — Ambulatory Visit (INDEPENDENT_AMBULATORY_CARE_PROVIDER_SITE_OTHER): Payer: PPO | Admitting: Adult Health

## 2020-05-18 VITALS — BP 106/80 | Temp 98.5°F | Wt 232.0 lb

## 2020-05-18 DIAGNOSIS — K429 Umbilical hernia without obstruction or gangrene: Secondary | ICD-10-CM | POA: Diagnosis not present

## 2020-05-18 NOTE — Progress Notes (Signed)
Subjective:    Patient ID: Raymond Mann, male    DOB: 11-Mar-1956, 64 y.o.   MRN: [id number]  HPI 64 year old male who  has a past medical history of Cataract of right eye, ED (erectile dysfunction), Essential hypertension, Fatty liver, Hyperlipidemia, Myopia, Ocular disease, Pseudophakia of left eye, Retinal detachment, Shingles, and Sight deterioration.  He presents to the office today for concern of helical hernia.  Reports that his wife first noticed this about 2 weeks ago when they were at the beach.  He denies any signs or symptoms.  Was doing some heavy lifting at home and also helping his son move.  Denies bruising around his umbilicus, changes in bowel movements, or abdominal pain.   Review of Systems   See HPI   Past Medical History:  Diagnosis Date  . Cataract of right eye   . ED (erectile dysfunction)   . Essential hypertension   . Fatty liver   . Hyperlipidemia   . Myopia   . Ocular disease   . Pseudophakia of left eye   . Retinal detachment    both eyes  . Shingles   . Sight deterioration    right eye    Social History   Socioeconomic History  . Marital status: Single    Spouse name: Not on file  . Number of children: Not on file  . Years of education: Not on file  . Highest education level: Not on file  Occupational History  . Not on file  Tobacco Use  . Smoking status: Never Smoker  . Smokeless tobacco: Never Used  Substance and Sexual Activity  . Alcohol use: Yes    Comment: occasionally  . Drug use: No  . Sexual activity: Not on file  Other Topics Concern  . 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 Difficulty of Paying Living Expenses:   Food Insecurity:   . Worried About Programme researcher, broadcasting/film/video in the Last Year:   . Barista in the Last Year:   Transportation Needs:   . Automotive engineer (Medical):   Marland Kitchen Lack of Transportation (Non-Medical):   Physical Activity:   . Days of Exercise per Week:   . Minutes of Exercise per Session:   Stress:   . Feeling of Stress :   Social Connections:   . Frequency of Communication with Friends and Family:   . Frequency of Social Gatherings with Friends and Family:   . Attends Religious Services:   . Active Member of Clubs or Organizations:   . Attends Banker Meetings:   Marland Kitchen Marital Status:   Intimate Partner Violence:   . Fear of Current or Ex-Partner:   . Emotionally Abused:   Marland Kitchen Physically Abused:   . Sexually Abused:     Past Surgical History:  Procedure Laterality Date  . bilateral retinal detachment     x2 each eye  . CAPSULOTOMY  2001   left eye  . cryopexy  1995   right eye  . scleral buckling  1995   left eye  . VITRECTOMY  1995   both eyes    Family History  Problem Relation Age of Onset  . Stroke Father   . Heart attack Father   .  Dementia Mother   . Stomach cancer Mother 69  . Retinal detachment Brother     Allergies  Allergen Reactions  . Bee Pollen Anaphylaxis    Current Outpatient Medications on File Prior to Visit  Medication Sig Dispense Refill  . amLODipine (NORVASC) 5 MG tablet TAKE ONE TABLET BY MOUTH DAILY 90 tablet 2  . pravastatin (PRAVACHOL) 20 MG tablet TAKE ONE TABLET BY MOUTH DAILY 90 tablet 3  . sildenafil (REVATIO) 20 MG tablet Take 1-5 tablets PRN 90 tablet 2  . triamcinolone cream (KENALOG) 0.1 % Apply 1 application topically 2 (two) times daily. 30 g 2   No current facility-administered medications on file prior to visit.    BP 106/80   Temp 98.5 F (36.9 C)   Wt 232 lb (105.2 kg)   BMI 32.36 kg/m       Objective:   Physical Exam Vitals and nursing note reviewed.  Constitutional:      Appearance: Normal appearance. He is obese.  Abdominal:     General: Abdomen is flat. Bowel sounds are normal.     Palpations: Abdomen is soft.     Hernia:  A hernia (Small umbilical hernia noted.  This hernia is easily reducible without discomfort) is present.  Skin:    General: Skin is warm and dry.     Capillary Refill: Capillary refill takes less than 2 seconds.  Neurological:     General: No focal deficit present.     Mental Status: He is alert and oriented to person, place, and time.  Psychiatric:        Mood and Affect: Mood normal.        Behavior: Behavior normal.        Thought Content: Thought content normal.        Judgment: Judgment normal.       Assessment & Plan:  1. Umbilical hernia without obstruction and without gangrene -We discussed the options include watchful waiting as well as surgical intervention.  He is going to work on weight loss first.  Red flags were reviewed and if he develops any then he is to follow-up in the office for further evaluation  [name], NP

## 2020-07-25 ENCOUNTER — Ambulatory Visit (INDEPENDENT_AMBULATORY_CARE_PROVIDER_SITE_OTHER): Payer: PPO

## 2020-07-25 DIAGNOSIS — Z0000 Encounter for general adult medical examination without abnormal findings: Secondary | ICD-10-CM | POA: Diagnosis not present

## 2020-07-25 NOTE — Patient Instructions (Signed)
Mr. Raymond Mann , Thank you for taking time to come for your Medicare Wellness Visit. I appreciate your ongoing commitment to your health goals. Please review the following plan we discussed and let me know if I can assist you in the future.   Screening recommendations/referrals: Colonoscopy: Up to date, next due 07/09/2027 Recommended yearly ophthalmology/optometry visit for glaucoma screening and checkup Recommended yearly dental visit for hygiene and checkup  Vaccinations: Influenza vaccine: Currently due, you may receive at your local pharmacy or in our office  Pneumococcal vaccine: Currently due for Pneumovax 23, you may receive this at our office or your local pharmacy Tdap vaccine: Up to date next due 08/14/2027 Shingles vaccine: Completed series    Advanced directives: Advance directive discussed with you today. Even though you declined this today please call our office should you change your mind and we can give you the proper paperwork for you to fill out.   Conditions/risks identified: None   Next appointment: 09/29/2020 @ 9:00 am with [name], NP  Preventive Care 66 Years and Older, Male Preventive care refers to lifestyle choices and visits with your health care provider that can promote health and wellness. What does preventive care include?  A yearly physical exam. This is also called an annual well check.  Dental exams once or twice a year.  Routine eye exams. Ask your health care provider how often you should have your eyes checked.  Personal lifestyle choices, including:  Daily care of your teeth and gums.  Regular physical activity.  Eating a healthy diet.  Avoiding tobacco and drug use.  Limiting alcohol use.  Practicing safe sex.  Taking low doses of aspirin every day.  Taking vitamin and mineral supplements as recommended by your health care provider. What happens during an annual well check? The services and screenings done by your health care  provider during your annual well check will depend on your age, overall health, lifestyle risk factors, and family history of disease. Counseling  Your health care provider may ask you questions about your:  Alcohol use.  Tobacco use.  Drug use.  Emotional well-being.  Home and relationship well-being.  Sexual activity.  Eating habits.  History of falls.  Memory and ability to understand (cognition).  Work and work Astronomer. Screening  You may have the following tests or measurements:  Height, weight, and BMI.  Blood pressure.  Lipid and cholesterol levels. These may be checked every 5 years, or more frequently if you are over 50 years old.  Skin check.  Lung cancer screening. You may have this screening every year starting at age 25 if you have a 30-pack-year history of smoking and currently smoke or have quit within the past 15 years.  Fecal occult blood test (FOBT) of the stool. You may have this test every year starting at age 28.  Flexible sigmoidoscopy or colonoscopy. You may have a sigmoidoscopy every 5 years or a colonoscopy every 10 years starting at age 16.  Prostate cancer screening. Recommendations will vary depending on your family history and other risks.  Hepatitis C blood test.  Hepatitis B blood test.  Sexually transmitted disease (STD) testing.  Diabetes screening. This is done by checking your blood sugar (glucose) after you have not eaten for a while (fasting). You may have this done every 1-3 years.  Abdominal aortic aneurysm (AAA) screening. You may need this if you are a current or former smoker.  Osteoporosis. You may be screened starting at age 63 if  you are at high risk. Talk with your health care provider about your test results, treatment options, and if necessary, the need for more tests. Vaccines  Your health care provider may recommend certain vaccines, such as:  Influenza vaccine. This is recommended every year.  Tetanus,  diphtheria, and acellular pertussis (Tdap, Td) vaccine. You may need a Td booster every 10 years.  Zoster vaccine. You may need this after age 75.  Pneumococcal 13-valent conjugate (PCV13) vaccine. One dose is recommended after age 34.  Pneumococcal polysaccharide (PPSV23) vaccine. One dose is recommended after age 32. Talk to your health care provider about which screenings and vaccines you need and how often you need them. This information is not intended to replace advice given to you by your health care provider. Make sure you discuss any questions you have with your health care provider. Document Released: 10/21/2015 Document Revised: 06/13/2016 Document Reviewed: 07/26/2015 Elsevier Interactive Patient Education  2017 [place] Prevention in the Home Falls can cause injuries. They can happen to people of all ages. There are many things you can do to make your home safe and to help prevent falls. What can I do on the outside of my home?  Regularly fix the edges of walkways and driveways and fix any cracks.  Remove anything that might make you trip as you walk through a door, such as a raised step or threshold.  Trim any bushes or trees on the path to your home.  Use bright outdoor lighting.  Clear any walking paths of anything that might make someone trip, such as rocks or tools.  Regularly check to see if handrails are loose or broken. Make sure that both sides of any steps have handrails.  Any raised decks and porches should have guardrails on the edges.  Have any leaves, snow, or ice cleared regularly.  Use sand or salt on walking paths during winter.  Clean up any spills in your garage right away. This includes oil or grease spills. What can I do in the bathroom?  Use night lights.  Install grab bars by the toilet and in the tub and shower. Do not use towel bars as grab bars.  Use non-skid mats or decals in the tub or shower.  If you need to sit down in  the shower, use a plastic, non-slip stool.  Keep the floor dry. Clean up any water that spills on the floor as soon as it happens.  Remove soap buildup in the tub or shower regularly.  Attach bath mats securely with double-sided non-slip rug tape.  Do not have throw rugs and other things on the floor that can make you trip. What can I do in the bedroom?  Use night lights.  Make sure that you have a light by your bed that is easy to reach.  Do not use any sheets or blankets that are too big for your bed. They should not hang down onto the floor.  Have a firm chair that has side arms. You can use this for support while you get dressed.  Do not have throw rugs and other things on the floor that can make you trip. What can I do in the kitchen?  Clean up any spills right away.  Avoid walking on wet floors.  Keep items that you use a lot in easy-to-reach places.  If you need to reach something above you, use a strong step stool that has a grab bar.  Keep electrical cords  out of the way.  Do not use floor polish or wax that makes floors slippery. If you must use wax, use non-skid floor wax.  Do not have throw rugs and other things on the floor that can make you trip. What can I do with my stairs?  Do not leave any items on the stairs.  Make sure that there are handrails on both sides of the stairs and use them. Fix handrails that are broken or loose. Make sure that handrails are as long as the stairways.  Check any carpeting to make sure that it is firmly attached to the stairs. Fix any carpet that is loose or worn.  Avoid having throw rugs at the top or bottom of the stairs. If you do have throw rugs, attach them to the floor with carpet tape.  Make sure that you have a light switch at the top of the stairs and the bottom of the stairs. If you do not have them, ask someone to add them for you. What else can I do to help prevent falls?  Wear shoes that:  Do not have high  heels.  Have rubber bottoms.  Are comfortable and fit you well.  Are closed at the toe. Do not wear sandals.  If you use a stepladder:  Make sure that it is fully opened. Do not climb a closed stepladder.  Make sure that both sides of the stepladder are locked into place.  Ask someone to hold it for you, if possible.  Clearly mark and make sure that you can see:  Any grab bars or handrails.  First and last steps.  Where the edge of each step is.  Use tools that help you move around (mobility aids) if they are needed. These include:  Canes.  Walkers.  Scooters.  Crutches.  Turn on the lights when you go into a dark area. Replace any light bulbs as soon as they burn out.  Set up your furniture so you have a clear path. Avoid moving your furniture around.  If any of your floors are uneven, fix them.  If there are any pets around you, be aware of where they are.  Review your medicines with your doctor. Some medicines can make you feel dizzy. This can increase your chance of falling. Ask your doctor what other things that you can do to help prevent falls. This information is not intended to replace advice given to you by your health care provider. Make sure you discuss any questions you have with your health care provider. Document Released: 07/21/2009 Document Revised: 03/01/2016 Document Reviewed: 10/29/2014 Elsevier Interactive Patient Education  2017 [employer].

## 2020-07-25 NOTE — Progress Notes (Signed)
Subjective:   Raymond Mann is a 64 y.o. male who presents for Medicare Annual/Subsequent preventive examination.  I connected with [name] today by telephone and verified that I am speaking with the correct person using two identifiers. Location patient: home Location provider: work Persons participating in the virtual visit: patient, provider.   I discussed the limitations, risks, security and privacy concerns of performing an evaluation and management service by telephone and the availability of in person appointments. I also discussed with the patient that there may be a patient responsible charge related to this service. The patient expressed understanding and verbally consented to this telephonic visit.    Interactive audio and video telecommunications were attempted between this provider and patient, however failed, due to patient having technical difficulties OR patient did not have access to video capability.  We continued and completed visit with audio only.      Review of Systems    N/A Cardiac Risk Factors include: advanced age (>70men, >3 women);dyslipidemia;hypertension     Objective:    Today's Vitals   There is no height or weight on file to calculate BMI.  Advanced Directives 07/25/2020 07/23/2019  Does Patient Have a Medical Advance Directive? No No  Would patient like information on creating a medical advance directive? No - Patient declined Yes (MAU/Ambulatory/Procedural Areas - Information given)    Current Medications (verified) Outpatient Encounter Medications as of 07/25/2020  Medication Sig  . amLODipine (NORVASC) 5 MG tablet TAKE ONE TABLET BY MOUTH DAILY  . betamethasone dipropionate 0.05 % cream Apply topically.  Marland Kitchen ketoconazole (NIZORAL) 2 % cream SMARTSIG:1 Application Topical 1 to 2 Times Daily  . pravastatin (PRAVACHOL) 20 MG tablet TAKE ONE TABLET BY MOUTH DAILY  . sildenafil (REVATIO) 20 MG tablet Take 1-5 tablets PRN  . triamcinolone  cream (KENALOG) 0.1 % Apply 1 application topically 2 (two) times daily. (Patient not taking: Reported on 07/25/2020)   No facility-administered encounter medications on file as of 07/25/2020.    Allergies (verified) Bee venom   History: Past Medical History:  Diagnosis Date  . Cataract of right eye   . ED (erectile dysfunction)   . Essential hypertension   . Fatty liver   . Hyperlipidemia   . Myopia   . Ocular disease   . Pseudophakia of left eye   . Retinal detachment    both eyes  . Shingles   . Sight deterioration    right eye   Past Surgical History:  Procedure Laterality Date  . bilateral retinal detachment     x2 each eye  . CAPSULOTOMY  2001   left eye  . cryopexy  1995   right eye  . scleral buckling  1995   left eye  . VITRECTOMY  1995   both eyes   Family History  Problem Relation Age of Onset  . Stroke Father   . Heart attack Father   . Dementia Mother   . Stomach cancer Mother 10  . Retinal detachment Brother    Social History   Socioeconomic History  . Marital status: Single    Spouse name: Not on file  . Number of children: Not on file  . Years of education: Not on file  . Highest education level: Not on file  Occupational History  . Not on file  Tobacco Use  . Smoking status: Never Smoker  . Smokeless tobacco: Never Used  Substance and Sexual Activity  . Alcohol use: Yes    Comment:  occasionally  . Drug use: No  . Sexual activity: Not on file  Other Topics Concern  . 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 Difficulty of Paying Living Expenses: Not hard at all  Food Insecurity: No Food Insecurity  . Worried About Programme researcher, broadcasting/film/videounning Out of Food in the Last Year: Never true  . Ran Out of Food in the Last Year: Never true  Transportation Needs: No Transportation Needs  . Lack of  Transportation (Medical): No  . Lack of Transportation (Non-Medical): No  Physical Activity: Sufficiently Active  . Days of Exercise per Week: 5 days  . Minutes of Exercise per Session: 30 min  Stress: No Stress Concern Present  . Feeling of Stress : Not at all  Social Connections: Socially Integrated  . Frequency of Communication with Friends and Family: More than three times a week  . Frequency of Social Gatherings with Friends and Family: More than three times a week  . Attends Religious Services: More than 4 times per year  . Active Member of Clubs or Organizations: Yes  . Attends BankerClub or Organization Meetings: Never  . Marital Status: Married    Tobacco Counseling Counseling given: Not Answered   Clinical Intake:  Pre-visit preparation completed: Yes  Pain : No/denies pain     Nutritional Risks: None Diabetes: No  How often do you need to have someone help you when you read instructions, pamphlets, or other written materials from your doctor or pharmacy?: 1 - Never What is the last grade level you completed in school?: Bachelors Degree  Diabetic?No  Interpreter Needed?: No  Information entered by :: SCrews,LPN   Activities of Daily Living In your present state of health, do you have any difficulty performing the following activities: 07/25/2020  Hearing? N  Vision? N  Difficulty concentrating or making decisions? N  Walking or climbing stairs? N  Dressing or bathing? N  Doing errands, shopping? N  Preparing Food and eating ? N  Using the Toilet? N  In the past six months, have you accidently leaked urine? N  Do you have problems with loss of bowel control? N  Managing your Medications? N  Managing your Finances? N  Housekeeping or managing your Housekeeping? N  Some recent data might be hidden    Patient Care Team: Shirline FreesNafziger, Cory, NP as PCP - General (Family Medicine)  Indicate any recent Medical Services you may have received from other than [facility]  providers in the past year (date may be approximate).     Assessment:   This is a routine wellness examination for Raymond Mann.  Hearing/Vision screen  Hearing Screening   125Hz  250Hz  500Hz  1000Hz  2000Hz  3000Hz  4000Hz  6000Hz  8000Hz   Right ear:           Left ear:           Vision Screening Comments: Patient states gets eye checked once per year    Dietary issues and exercise activities discussed: Current Exercise Habits: Home exercise routine, Type of exercise: walking, Time (Minutes): 30, Frequency (Times/Week): 5, Weekly Exercise (Minutes/Week): 150, Intensity: Moderate, Exercise limited by: None identified  Goals    . DIET - INCREASE LEAN PROTEINS    . DIET - REDUCE PORTION SIZE    . Exercise 30  minutes 5 times per week    . Increase physical activity      Depression Screen PHQ 2/9 Scores 07/25/2020 07/23/2019  PHQ - 2 Score 0 0  PHQ- 9 Score 0 -    Fall Risk Fall Risk  07/25/2020 07/23/2019  Falls in the past year? 0 0  Number falls in past yr: 0 -  Injury with Fall? 0 -  Risk for fall due to : No Fall Risks -  Follow up Falls evaluation completed;Falls prevention discussed -    Any stairs in or around the home? Yes  If so, are there any without handrails? No  Home free of loose throw rugs in walkways, pet beds, electrical cords, etc? Yes  Adequate lighting in your home to reduce risk of falls? Yes   ASSISTIVE DEVICES UTILIZED TO PREVENT FALLS:  Life alert? No  Use of a cane, walker or w/c? No  Grab bars in the bathroom? No  Shower chair or bench in shower? No  Elevated toilet seat or a handicapped toilet? No     Cognitive Function:     6CIT Screen 07/25/2020  What Year? 0 points  What month? 0 points  What time? 0 points  Count back from 20 0 points  Months in reverse 0 points  Repeat phrase 0 points  Total Score 0    Immunizations Immunization History  Administered Date(s) Administered  . Influenza, Quadrivalent, Recombinant, Inj, Pf 07/26/2018  .  Influenza,inj,Quad PF,6+ Mos 08/10/2017  . PFIZER SARS-COV-2 Vaccination 10/28/2019, 11/18/2019, 07/08/2020  . PPD Test 04/03/2019  . Pneumococcal Conjugate-13 07/23/2019  . Tdap 04/02/2014  . Zoster 09/25/2017  . Zoster Recombinat (Shingrix) 06/01/2019, 08/29/2019    TDAP status: Up to date Flu Vaccine status: Up to date Pneumococcal vaccine status: Up to date Covid-19 vaccine status: Completed vaccines  Qualifies for Shingles Vaccine? Yes   Zostavax completed No   Shingrix Completed?: Yes  Screening Tests Health Maintenance  Topic Date Due  . INFLUENZA VACCINE  05/08/2020  . PNA vac Low Risk Adult (2 of 2 - PPSV23) 07/22/2020  . TETANUS/TDAP  04/02/2024  . COLONOSCOPY  07/09/2027  . COVID-19 Vaccine  Completed  . Hepatitis C Screening  Completed    Health Maintenance  Health Maintenance Due  Topic Date Due  . INFLUENZA VACCINE  05/08/2020  . PNA vac Low Risk Adult (2 of 2 - PPSV23) 07/22/2020    Colorectal cancer screening: Completed 07/08/2017. Repeat every 10 years  Lung Cancer Screening: (Low Dose CT Chest recommended if Age 52-80 years, 30 pack-year currently smoking OR have quit w/in 15years.) does not qualify.   Lung Cancer Screening Referral: N/A  Additional Screening:  Hepatitis C Screening: does qualify; Completed 09/09/2018  Vision Screening: Recommended annual ophthalmology exams for early detection of glaucoma and other disorders of the eye. Is the patient up to date with their annual eye exam?  Yes  Who is the provider or what is the name of the office in which the patient attends annual eye exams? [facility] Dr. Leveda Anna, and [facility]  If pt is not established with a provider, would they like to be referred to a provider to establish care? No .   Dental Screening: Recommended annual dental exams for proper oral hygiene  Community Resource Referral / Chronic Care Management: CRR required this visit?  No   CCM required this visit?  No        Plan:     I have personally reviewed and  noted the following in the patient's chart:   . Medical and social history . Use of alcohol, tobacco or illicit drugs  . Current medications and supplements . Functional ability and status . Nutritional status . Physical activity . Advanced directives . List of other physicians . Hospitalizations, surgeries, and ER visits in previous 12 months . Vitals . Screenings to include cognitive, depression, and falls . Referrals and appointments  In addition, I have reviewed and discussed with patient certain preventive protocols, quality metrics, and best practice recommendations. A written personalized care plan for preventive services as well as general preventive health recommendations were provided to patient.     Theodora Blow, LPN   [id number]   Nurse Notes: None

## 2020-07-28 ENCOUNTER — Ambulatory Visit (INDEPENDENT_AMBULATORY_CARE_PROVIDER_SITE_OTHER): Payer: PPO

## 2020-07-28 ENCOUNTER — Other Ambulatory Visit: Payer: Self-pay

## 2020-07-28 DIAGNOSIS — Z23 Encounter for immunization: Secondary | ICD-10-CM | POA: Diagnosis not present

## 2020-07-29 DIAGNOSIS — H40053 Ocular hypertension, bilateral: Secondary | ICD-10-CM | POA: Diagnosis not present

## 2020-07-29 DIAGNOSIS — Z961 Presence of intraocular lens: Secondary | ICD-10-CM | POA: Diagnosis not present

## 2020-07-29 DIAGNOSIS — H5213 Myopia, bilateral: Secondary | ICD-10-CM | POA: Diagnosis not present

## 2020-07-29 DIAGNOSIS — H52203 Unspecified astigmatism, bilateral: Secondary | ICD-10-CM | POA: Diagnosis not present

## 2020-07-29 DIAGNOSIS — H524 Presbyopia: Secondary | ICD-10-CM | POA: Diagnosis not present

## 2020-08-23 ENCOUNTER — Ambulatory Visit: Payer: PPO

## 2020-08-26 DIAGNOSIS — L03213 Periorbital cellulitis: Secondary | ICD-10-CM | POA: Diagnosis not present

## 2020-08-26 DIAGNOSIS — H00015 Hordeolum externum left lower eyelid: Secondary | ICD-10-CM | POA: Diagnosis not present

## 2020-08-29 ENCOUNTER — Other Ambulatory Visit: Payer: Self-pay | Admitting: Adult Health

## 2020-08-29 DIAGNOSIS — I10 Essential (primary) hypertension: Secondary | ICD-10-CM

## 2020-09-09 ENCOUNTER — Telehealth: Payer: Self-pay | Admitting: Adult Health

## 2020-09-09 DIAGNOSIS — Z125 Encounter for screening for malignant neoplasm of prostate: Secondary | ICD-10-CM

## 2020-09-09 DIAGNOSIS — E782 Mixed hyperlipidemia: Secondary | ICD-10-CM

## 2020-09-09 DIAGNOSIS — N529 Male erectile dysfunction, unspecified: Secondary | ICD-10-CM

## 2020-09-09 DIAGNOSIS — I10 Essential (primary) hypertension: Secondary | ICD-10-CM

## 2020-09-09 NOTE — Telephone Encounter (Signed)
This patient is scheduled for a CPE on 12.23.21/also coming in for a lab visit prior to that on 12.20.21/plz advise what labs you would like drawn and I would be happy to order them/thx dmf

## 2020-09-13 NOTE — Telephone Encounter (Signed)
I would just do his basic labs, CMP, CBC, TSH, PSA, and lipid panel.  Thanks  [employer]

## 2020-09-14 NOTE — Addendum Note (Signed)
Addended by: Lerry Liner on: 09/14/2020 03:27 PM   Modules accepted: Orders

## 2020-09-14 NOTE — Telephone Encounter (Signed)
Placed future orders per PCP for upcoming lab visit/thx dmf

## 2020-09-22 ENCOUNTER — Encounter: Payer: PPO | Admitting: Adult Health

## 2020-09-26 ENCOUNTER — Other Ambulatory Visit: Payer: Self-pay

## 2020-09-26 ENCOUNTER — Other Ambulatory Visit (INDEPENDENT_AMBULATORY_CARE_PROVIDER_SITE_OTHER): Payer: PPO

## 2020-09-26 DIAGNOSIS — E782 Mixed hyperlipidemia: Secondary | ICD-10-CM

## 2020-09-26 DIAGNOSIS — Z125 Encounter for screening for malignant neoplasm of prostate: Secondary | ICD-10-CM | POA: Diagnosis not present

## 2020-09-26 DIAGNOSIS — I10 Essential (primary) hypertension: Secondary | ICD-10-CM | POA: Diagnosis not present

## 2020-09-26 DIAGNOSIS — N529 Male erectile dysfunction, unspecified: Secondary | ICD-10-CM | POA: Diagnosis not present

## 2020-09-27 LAB — COMPREHENSIVE METABOLIC PANEL
AG Ratio: 1.9 (calc) (ref 1.0–2.5)
ALT: 24 U/L (ref 9–46)
AST: 17 U/L (ref 10–35)
Albumin: 4.8 g/dL (ref 3.6–5.1)
Alkaline phosphatase (APISO): 74 U/L (ref 35–144)
BUN: 14 mg/dL (ref 7–25)
CO2: 25 mmol/L (ref 20–32)
Calcium: 9.4 mg/dL (ref 8.6–10.3)
Chloride: 107 mmol/L (ref 98–110)
Creat: 1.05 mg/dL (ref 0.70–1.25)
Globulin: 2.5 g/dL (calc) (ref 1.9–3.7)
Glucose, Bld: 109 mg/dL — ABNORMAL HIGH (ref 65–99)
Potassium: 4.4 mmol/L (ref 3.5–5.3)
Sodium: 142 mmol/L (ref 135–146)
Total Bilirubin: 0.9 mg/dL (ref 0.2–1.2)
Total Protein: 7.3 g/dL (ref 6.1–8.1)

## 2020-09-27 LAB — CBC WITH DIFFERENTIAL/PLATELET
Absolute Monocytes: 400 cells/uL (ref 200–950)
Basophils Absolute: 40 cells/uL (ref 0–200)
Basophils Relative: 0.8 %
Eosinophils Absolute: 80 cells/uL (ref 15–500)
Eosinophils Relative: 1.6 %
HCT: 48.8 % (ref 38.5–50.0)
Hemoglobin: 16.6 g/dL (ref 13.2–17.1)
Lymphs Abs: 1925 cells/uL (ref 850–3900)
MCH: 31.1 pg (ref 27.0–33.0)
MCHC: 34 g/dL (ref 32.0–36.0)
MCV: 91.4 fL (ref 80.0–100.0)
MPV: 10 fL (ref 7.5–12.5)
Monocytes Relative: 8 %
Neutro Abs: 2555 cells/uL (ref 1500–7800)
Neutrophils Relative %: 51.1 %
Platelets: 219 10*3/uL (ref 140–400)
RBC: 5.34 10*6/uL (ref 4.20–5.80)
RDW: 13.1 % (ref 11.0–15.0)
Total Lymphocyte: 38.5 %
WBC: 5 10*3/uL (ref 3.8–10.8)

## 2020-09-27 LAB — TSH: TSH: 3.86 mIU/L (ref 0.40–4.50)

## 2020-09-27 LAB — LIPID PANEL
Cholesterol: 184 mg/dL (ref <200)
HDL: 40 mg/dL (ref >=40)
LDL Cholesterol (Calc): 117 mg/dL (calc) — ABNORMAL HIGH
Non-HDL Cholesterol (Calc): 144 mg/dL (calc) — ABNORMAL HIGH (ref <130)
Total CHOL/HDL Ratio: 4.6 (calc) (ref <5.0)
Triglycerides: 157 mg/dL — ABNORMAL HIGH (ref <150)

## 2020-09-27 LAB — PSA: PSA: 0.73 ng/mL (ref <=4.0)

## 2020-09-28 ENCOUNTER — Other Ambulatory Visit: Payer: Self-pay | Admitting: Adult Health

## 2020-09-29 ENCOUNTER — Other Ambulatory Visit: Payer: Self-pay

## 2020-09-29 ENCOUNTER — Ambulatory Visit (INDEPENDENT_AMBULATORY_CARE_PROVIDER_SITE_OTHER): Payer: PPO | Admitting: Adult Health

## 2020-09-29 ENCOUNTER — Encounter: Payer: Self-pay | Admitting: Adult Health

## 2020-09-29 VITALS — BP 126/82 | HR 83 | Temp 99.0°F | Ht 71.26 in | Wt 232.6 lb

## 2020-09-29 DIAGNOSIS — I10 Essential (primary) hypertension: Secondary | ICD-10-CM

## 2020-09-29 DIAGNOSIS — N529 Male erectile dysfunction, unspecified: Secondary | ICD-10-CM | POA: Diagnosis not present

## 2020-09-29 DIAGNOSIS — E782 Mixed hyperlipidemia: Secondary | ICD-10-CM | POA: Diagnosis not present

## 2020-09-29 DIAGNOSIS — Z0000 Encounter for general adult medical examination without abnormal findings: Secondary | ICD-10-CM

## 2020-09-29 DIAGNOSIS — Z125 Encounter for screening for malignant neoplasm of prostate: Secondary | ICD-10-CM

## 2020-09-29 MED ORDER — PRAVASTATIN SODIUM 20 MG PO TABS: 20.0000 mg | ORAL_TABLET | Freq: Every day | ORAL | 3 refills | Status: DC

## 2020-09-29 NOTE — Progress Notes (Signed)
Subjective:    Patient ID: Raymond Mann, male    DOB: 09-Apr-1956, 64 y.o.   MRN: [id number]  HPI Patient presents for yearly preventative medicine examination. He is a pleasant 64 year old male who  has a past medical history of Cataract of right eye, ED (erectile dysfunction), Essential hypertension, Fatty liver, Hyperlipidemia, Myopia, Ocular disease, Pseudophakia of left eye, Retinal detachment, Shingles, and Sight deterioration.  Essential Hypertension -takes Norvasc 5 mg daily.  He denies dizziness, lightheadedness, chest pain, or shortness of breath BP Readings from Last 3 Encounters:  09/29/20 126/82  05/18/20 106/80  07/23/19 114/82   Hyperlipidemia-trolled with pravastatin 20 mg daily.  He denies myalgia or fatigue Lab Results  Component Value Date   CHOL 184 09/26/2020   HDL 40 09/26/2020   LDLCALC 117 (H) 09/26/2020   TRIG 157 (H) 09/26/2020   CHOLHDL 4.6 09/26/2020   ED - Takes sildenafil PRN  All immunizations and health maintenance protocols were reviewed with the patient and needed orders were placed. He is up to date on all vaccinations   Appropriate screening laboratory values were ordered for the patient including screening of hyperlipidemia, renal function and hepatic function. If indicated by BPH, a PSA was ordered.  Medication reconciliation,  past medical history, social history, problem list and allergies were reviewed in detail with the patient  Goals were established with regard to weight loss, exercise, and  diet in compliance with medications Wt Readings from Last 3 Encounters:  09/29/20 232 lb 9.6 oz (105.5 kg)  05/18/20 232 lb (105.2 kg)  07/23/19 240 lb (108.9 kg)    He is up-to-date on routine colon cancer screening  He has no acute issues    Review of Systems  Constitutional: Negative.   HENT: Negative.   Eyes: Negative.   Respiratory: Negative.   Cardiovascular: Negative.   Gastrointestinal: Negative.   Endocrine: Negative.    Genitourinary: Negative.   Musculoskeletal: Negative.   Skin: Negative.   Allergic/Immunologic: Negative.   Neurological: Negative.   Hematological: Negative.   Psychiatric/Behavioral: Negative.   All other systems reviewed and are negative.  Past Medical History:  Diagnosis Date   Cataract of right eye    ED (erectile dysfunction)    Essential hypertension    Fatty liver    Hyperlipidemia    Myopia    Ocular disease    Pseudophakia of left eye    Retinal detachment    both eyes   Shingles    Sight deterioration    right eye    Social History   Socioeconomic History   Marital status: Married    Spouse name: Not on file   Number of children: 2   Years of education: Not on file   Highest education level: Bachelor's degree (e.g., BA, AB, BS)  Occupational History   Not on file  Tobacco Use   Smoking status: Never Smoker   Smokeless tobacco: Never Used  Substance and Sexual Activity   Alcohol use: Yes    Comment: occasionally   Drug use: No   Sexual activity: Not on file  Other Topics Concern   Not on file  Social History 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    One grandchild       Social Determinants of Health   Financial Resource Strain: Low Risk    Difficulty of Paying Living Expenses: Not hard at all  Food Insecurity:  No Food Insecurity   Worried About Programme researcher, broadcasting/film/video in the Last Year: Never true   Ran Out of Food in the Last Year: Never true  Transportation Needs: No Transportation Needs   Lack of Transportation (Medical): No   Lack of Transportation (Non-Medical): No  Physical Activity: Sufficiently Active   Days of Exercise per Week: 5 days   Minutes of Exercise per Session: 30 min  Stress: No Stress Concern Present   Feeling of Stress : Not at all  Social Connections: Socially Integrated   Frequency of Communication with Friends and Family: More than three times a week    Frequency of Social Gatherings with Friends and Family: More than three times a week   Attends Religious Services: More than 4 times per year   Active Member of [employer] or Organizations: Yes   Attends Banker Meetings: Never   Marital Status: Married  Catering manager Violence: Not At Risk   Fear of Current or Ex-Partner: No   Emotionally Abused: No   Physically Abused: No   Sexually Abused: No    Past Surgical History:  Procedure Laterality Date   bilateral retinal detachment     x2 each eye   CAPSULOTOMY  2001   left eye   cryopexy  1995   right eye   scleral buckling  1995   left eye   VITRECTOMY  1995   both eyes    Family History  Problem Relation Age of Onset   Stroke Father    Heart attack Father    Dementia Mother    Stomach cancer Mother 68   Retinal detachment Brother     Allergies  Allergen Reactions   Bee Venom Swelling   Lisinopril Cough    Current Outpatient Medications on File Prior to Visit  Medication Sig Dispense Refill   amLODipine (NORVASC) 5 MG tablet TAKE ONE TABLET BY MOUTH DAILY 90 tablet 2   betamethasone dipropionate 0.05 % cream Apply topically.     ketoconazole (NIZORAL) 2 % cream SMARTSIG:1 Application Topical 1 to 2 Times Daily     pravastatin (PRAVACHOL) 20 MG tablet TAKE ONE TABLET BY MOUTH DAILY 90 tablet 3   sildenafil (REVATIO) 20 MG tablet Take 1-5 tablets PRN 90 tablet 2   triamcinolone cream (KENALOG) 0.1 % Apply 1 application topically 2 (two) times daily. (Patient not taking: Reported on 07/25/2020) 30 g 2   No current facility-administered medications on file prior to visit.    BP 126/82 (BP Location: Left Arm, Patient Position: Sitting, Cuff Size: Normal)    Pulse 83    Temp 99 F (37.2 C) (Oral)    Ht 5' 11.26" (1.81 m)    Wt 232 lb 9.6 oz (105.5 kg)    SpO2 95%    BMI 32.20 kg/m       Objective:   Physical Exam Vitals and nursing note reviewed.  Constitutional:      General:  He is not in acute distress.    Appearance: Normal appearance. He is well-developed. He is obese.  HENT:     Head: Normocephalic and atraumatic.     Right Ear: Tympanic membrane, ear canal and external ear normal. There is no impacted cerumen.     Left Ear: Tympanic membrane, ear canal and external ear normal. There is no impacted cerumen.     Nose: Nose normal. No congestion or rhinorrhea.     Mouth/Throat:     Mouth: Mucous membranes are moist.  Pharynx: Oropharynx is clear. No oropharyngeal exudate or posterior oropharyngeal erythema.  Eyes:     General:        Right eye: No discharge.        Left eye: No discharge.     Extraocular Movements: Extraocular movements intact.     Conjunctiva/sclera: Conjunctivae normal.     Pupils: Pupils are equal, round, and reactive to light.  Neck:     Vascular: No carotid bruit.     Trachea: No tracheal deviation.  Cardiovascular:     Rate and Rhythm: Normal rate and regular rhythm.     Pulses: Normal pulses.     Heart sounds: Normal heart sounds. No murmur heard. No friction rub. No gallop.   Pulmonary:     Effort: Pulmonary effort is normal. No respiratory distress.     Breath sounds: Normal breath sounds. No stridor. No wheezing, rhonchi or rales.  Chest:     Chest wall: No tenderness.  Abdominal:     General: Bowel sounds are normal. There is no distension.     Palpations: Abdomen is soft. There is no mass.     Tenderness: There is no abdominal tenderness. There is no right CVA tenderness, left CVA tenderness, guarding or rebound.     Hernia: No hernia is present.  Musculoskeletal:        General: No swelling, tenderness, deformity or signs of injury. Normal range of motion.     Right lower leg: No edema.     Left lower leg: No edema.  Lymphadenopathy:     Cervical: No cervical adenopathy.  Skin:    General: Skin is warm and dry.     Capillary Refill: Capillary refill takes less than 2 seconds.     Coloration: Skin is not  jaundiced or pale.     Findings: No bruising, erythema, lesion or rash.  Neurological:     General: No focal deficit present.     Mental Status: He is alert and oriented to person, place, and time.     Cranial Nerves: No cranial nerve deficit.     Sensory: No sensory deficit.     Motor: No weakness.     Coordination: Coordination normal.     Gait: Gait normal.     Deep Tendon Reflexes: Reflexes normal.  Psychiatric:        Mood and Affect: Mood normal.        Behavior: Behavior normal.        Thought Content: Thought content normal.        Judgment: Judgment normal.       Assessment & Plan:  1. Routine general medical examination at a health care facility - Patient had labs done prior to visit. We reviewed these labs in detail. He was advised to continue to exercise and work on diet - Follow up in one year year   2. Essential hypertension - Controlled. Continue with Norvasc 5 mg   3. Mixed hyperlipidemia - LDL and triglycerides have increased over the year. This is due to diet. Advised to continue Pravastatin 20 mg and work on lifestyle modifications  - pravastatin (PRAVACHOL) 20 MG tablet; Take 1 tablet (20 mg total) by mouth daily.  Dispense: 90 tablet; Refill: 3  4. Erectile dysfunction, unspecified erectile dysfunction type - Continue generic viagra PRN   5. Prostate cancer screening - PSA normal   [name], NP

## 2020-12-12 DIAGNOSIS — L308 Other specified dermatitis: Secondary | ICD-10-CM | POA: Diagnosis not present

## 2021-02-01 DIAGNOSIS — H40053 Ocular hypertension, bilateral: Secondary | ICD-10-CM | POA: Diagnosis not present

## 2021-02-17 DIAGNOSIS — M25561 Pain in right knee: Secondary | ICD-10-CM | POA: Diagnosis not present

## 2021-02-27 DIAGNOSIS — H40053 Ocular hypertension, bilateral: Secondary | ICD-10-CM | POA: Diagnosis not present

## 2021-05-24 ENCOUNTER — Other Ambulatory Visit: Payer: Self-pay | Admitting: Adult Health

## 2021-05-24 DIAGNOSIS — I10 Essential (primary) hypertension: Secondary | ICD-10-CM

## 2021-06-13 ENCOUNTER — Telehealth: Payer: Self-pay | Admitting: Adult Health

## 2021-06-13 NOTE — Telephone Encounter (Signed)
Left message for patient to call back and schedule Medicare Annual Wellness Visit (AWV) either virtually or in office. Left  my Zachery Conch number [phone]   Last AWV 07/25/20 please schedule at anytime with [facility] Nurse Health Advisor 1 or 2   This should be a 45 minute visit.   [employer] ins can be calendar year

## 2021-06-15 ENCOUNTER — Telehealth: Payer: Self-pay | Admitting: Adult Health

## 2021-06-15 NOTE — Telephone Encounter (Signed)
Left message asking pt to call [phone]  Please r/s 07/20/21  AWV  [facility] meeting at that time

## 2021-06-16 DIAGNOSIS — H40053 Ocular hypertension, bilateral: Secondary | ICD-10-CM | POA: Diagnosis not present

## 2021-07-20 ENCOUNTER — Ambulatory Visit: Payer: PPO

## 2021-07-20 ENCOUNTER — Telehealth: Payer: Self-pay | Admitting: Adult Health

## 2021-07-20 NOTE — Telephone Encounter (Signed)
LVM 07/20/21 both numbers AWV changed to a telephonic visit. PLEASE CONFIRM -khc

## 2021-07-26 ENCOUNTER — Ambulatory Visit (INDEPENDENT_AMBULATORY_CARE_PROVIDER_SITE_OTHER): Payer: PPO

## 2021-07-26 DIAGNOSIS — Z0000 Encounter for general adult medical examination without abnormal findings: Secondary | ICD-10-CM

## 2021-07-26 NOTE — Progress Notes (Signed)
Subjective:   Raymond Mann is a 65 y.o. male who presents for an Initial Medicare Annual Wellness Visit.  I connected with Raymond Mann today by telephone and verified that I am speaking with the correct person using two identifiers. Location patient: home Location provider: work Persons participating in the virtual visit: patient, provider.   I discussed the limitations, risks, security and privacy concerns of performing an evaluation and management service by telephone and the availability of in person appointments. I also discussed with the patient that there may be a patient responsible charge related to this service. The patient expressed understanding and verbally consented to this telephonic visit.    Interactive audio and video telecommunications were attempted between this provider and patient, however failed, due to patient having technical difficulties OR patient did not have access to video capability.  We continued and completed visit with audio only.    Review of Systems     Cardiac Risk Factors include: advanced age (>46men, >4 women);dyslipidemia;male gender     Objective:    Today's Vitals   There is no height or weight on file to calculate BMI.  Advanced Directives 07/26/2021 07/25/2020 07/23/2019  Does Patient Have a Medical Advance Directive? No No No  Would patient like information on creating a medical advance directive? No - Patient declined No - Patient declined Yes (MAU/Ambulatory/Procedural Areas - Information given)    Current Medications (verified) Outpatient Encounter Medications as of 07/26/2021  Medication Sig   amLODipine (NORVASC) 5 MG tablet TAKE ONE TABLET BY MOUTH DAILY   latanoprost (XALATAN) 0.005 % ophthalmic solution Place 1 drop into the left eye nightly.   pravastatin (PRAVACHOL) 20 MG tablet Take 1 tablet (20 mg total) by mouth daily.   sildenafil (REVATIO) 20 MG tablet Take 1-5 tablets PRN   betamethasone dipropionate 0.05 % cream  Apply topically. (Patient not taking: Reported on 07/26/2021)   ketoconazole (NIZORAL) 2 % cream SMARTSIG:1 Application Topical 1 to 2 Times Daily (Patient not taking: Reported on 07/26/2021)   No facility-administered encounter medications on file as of 07/26/2021.    Allergies (verified) Bee venom and Lisinopril   History: Past Medical History:  Diagnosis Date   Cataract of right eye    ED (erectile dysfunction)    Essential hypertension    Fatty liver    Hyperlipidemia    Myopia    Ocular disease    Pseudophakia of left eye    Retinal detachment    both eyes   Shingles    Sight deterioration    right eye   Past Surgical History:  Procedure Laterality Date   bilateral retinal detachment     x2 each eye   CAPSULOTOMY  2001   left eye   cryopexy  1995   right eye   scleral buckling  1995   left eye   VITRECTOMY  1995   both eyes   Family History  Problem Relation Age of Onset   Stroke Father    Heart attack Father    Dementia Mother    Stomach cancer Mother 74   Retinal detachment Brother    Social History   Socioeconomic History   Marital status: Married    Spouse name: Not on file   Number of children: 2   Years of education: Not on file   Highest education level: Bachelor's degree (e.g., BA, AB, BS)  Occupational History   Not on file  Tobacco Use   Smoking status: Never  Smokeless tobacco: Never  Substance and Sexual Activity   Alcohol use: Yes    Comment: occasionally   Drug use: No   Sexual activity: Not on file  Other Topics Concern   Not on file  Social History 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    One grandchild       Social Determinants of Health   Financial Resource Strain: Low Risk    Difficulty of Paying Living Expenses: Not hard at all  Food Insecurity: No Food Insecurity   Worried About Programme researcher, broadcasting/film/video in the Last Year: Never true   Barista in the Last Year: Never true   Transportation Needs: No Transportation Needs   Lack of Transportation (Medical): No   Lack of Transportation (Non-Medical): No  Physical Activity: Sufficiently Active   Days of Exercise per Week: 5 days   Minutes of Exercise per Session: 30 min  Stress: No Stress Concern Present   Feeling of Stress : Not at all  Social Connections: Socially Integrated   Frequency of Communication with Friends and Family: Twice a week   Frequency of Social Gatherings with Friends and Family: Twice a week   Attends Religious Services: More than 4 times per year   Active Member of [employer] or Organizations: Yes   Attends Engineer, structural: More than 4 times per year   Marital Status: Married    Tobacco Counseling Counseling given: Not Answered   Clinical Intake:  Pre-visit preparation completed: Yes  Pain : No/denies pain     Nutritional Risks: None Diabetes: No  How often do you need to have someone help you when you read instructions, pamphlets, or other written materials from your doctor or pharmacy?: 1 - Never What is the last grade level you completed in school?: BS  Diabetic?no   Interpreter Needed?: No  Information entered by :: L.Aireal Slater,LPN   Activities of Daily Living In your present state of health, do you have any difficulty performing the following activities: 07/26/2021  Hearing? N  Vision? N  Difficulty concentrating or making decisions? N  Walking or climbing stairs? N  Dressing or bathing? N  Doing errands, shopping? N  Preparing Food and eating ? N  Using the Toilet? N  In the past six months, have you accidently leaked urine? N  Do you have problems with loss of bowel control? N  Managing your Medications? N  Managing your Finances? N  Housekeeping or managing your Housekeeping? N  Some recent data might be hidden    Patient Care Team: [name], NP as PCP - General (Family Medicine)  Indicate any recent Medical Services you may have  received from other than [facility] providers in the past year (date may be approximate).     Assessment:   This is a routine wellness examination for [place].  Hearing/Vision screen Vision Screening - Comments:: Annual eye exams wear glasses   Dietary issues and exercise activities discussed: Current Exercise Habits: Home exercise routine, Type of exercise: walking, Time (Minutes): 30, Frequency (Times/Week): 3, Weekly Exercise (Minutes/Week): 90, Intensity: Mild, Exercise limited by: None identified   Goals Addressed   None    Depression Screen PHQ 2/9 Scores 07/26/2021 07/26/2021 07/25/2020 07/23/2019  PHQ - 2 Score 0 0 0 0  PHQ- 9 Score - - 0 -    Fall Risk Fall Risk  07/26/2021 07/25/2020 07/23/2019  Falls in the past  year? 0 0 0  Number falls in past yr: 0 0 -  Injury with Fall? 0 0 -  Risk for fall due to : No Fall Risks No Fall Risks -  Follow up Falls evaluation completed Falls evaluation completed;Falls prevention discussed -    FALL RISK PREVENTION PERTAINING TO THE HOME:  Any stairs in or around the home? Yes  If so, are there any without handrails? No  Home free of loose throw rugs in walkways, pet beds, electrical cords, etc? Yes  Adequate lighting in your home to reduce risk of falls? Yes   ASSISTIVE DEVICES UTILIZED TO PREVENT FALLS:  Life alert? No  Use of a cane, walker or w/c? No  Grab bars in the bathroom? No  Shower chair or bench in shower? No  Elevated toilet seat or a handicapped toilet? No    Cognitive Function:    Normal cognitive status assessed by direct observation by this Nurse Health Advisor. No abnormalities found.   6CIT Screen 07/25/2020  What Year? 0 points  What month? 0 points  What time? 0 points  Count back from 20 0 points  Months in reverse 0 points  Repeat phrase 0 points  Total Score 0    Immunizations Immunization History  Administered Date(s) Administered   Fluad Quad(high Dose 65+) 07/28/2020   Influenza,  Quadrivalent, Recombinant, Inj, Pf 07/26/2018   Influenza,inj,Quad PF,6+ Mos 08/10/2017   PFIZER(Purple Top)SARS-COV-2 Vaccination 10/28/2019, 11/18/2019, 07/08/2020   PPD Test 04/03/2019   Pneumococcal Conjugate-13 07/23/2019   Pneumococcal Polysaccharide-23 07/28/2020   Tdap 04/02/2014   Zoster Recombinat (Shingrix) 06/01/2019, 08/29/2019   Zoster, Live 09/25/2017    TDAP status: Up to date  Flu Vaccine status: Up to date  Pneumococcal vaccine status: Up to date  Covid-19 vaccine status: Completed vaccines  Qualifies for Shingles Vaccine? Yes   Zostavax completed No   Shingrix Completed?: Yes  Screening Tests Health Maintenance  Topic Date Due   COVID-19 Vaccine (4 - Booster for Pfizer series) 11/08/2020   INFLUENZA VACCINE  05/08/2021   TETANUS/TDAP  04/02/2024   COLONOSCOPY (Pts 45-42yrs Insurance coverage will need to be confirmed)  07/09/2027   Pneumonia Vaccine 52+ Years old  Completed   Hepatitis C Screening  Completed   Zoster Vaccines- Shingrix  Completed   HPV VACCINES  Aged Out    Health Maintenance  Health Maintenance Due  Topic Date Due   COVID-19 Vaccine (4 - Booster for Pfizer series) 11/08/2020   INFLUENZA VACCINE  05/08/2021    Colorectal cancer screening: Type of screening: Colonoscopy. Completed 07/08/2017. Repeat every 10 years  Lung Cancer Screening: (Low Dose CT Chest recommended if Age 57-80 years, 30 pack-year currently smoking OR have quit w/in 15years.) does not qualify.   Lung Cancer Screening Referral: n/a  Additional Screening:  Hepatitis C Screening: does not qualify; Completed 09/09/2018  Vision Screening: Recommended annual ophthalmology exams for early detection of glaucoma and other disorders of the eye. Is the patient up to date with their annual eye exam?  Yes  Who is the provider or what is the name of the office in which the patient attends annual eye exams? Dr.Hansel If pt is not established with a provider, would they  like to be referred to a provider to establish care? No .   Dental Screening: Recommended annual dental exams for proper oral hygiene  Community Resource Referral / Chronic Care Management: CRR required this visit?  No   CCM required this visit?  No      Plan:     I have personally reviewed and noted the following in the patient's chart:   Medical and social history Use of alcohol, tobacco or illicit drugs  Current medications and supplements including opioid prescriptions. Patient is not currently taking opioid prescriptions. Functional ability and status Nutritional status Physical activity Advanced directives List of other physicians Hospitalizations, surgeries, and ER visits in previous 12 months Vitals Screenings to include cognitive, depression, and falls Referrals and appointments  In addition, I have reviewed and discussed with patient certain preventive protocols, quality metrics, and best practice recommendations. A written personalized care plan for preventive services as well as general preventive health recommendations were provided to patient.     March Rummage, LPN   [id number]   Nurse Notes: none

## 2021-07-26 NOTE — Patient Instructions (Signed)
Mr. Raymond Mann , Thank you for taking time to come for your Medicare Wellness Visit. I appreciate your ongoing commitment to your health goals. Please review the following plan we discussed and let me know if I can assist you in the future.   Screening recommendations/referrals: Colonoscopy: 07/08/2017  due 2028 Recommended yearly ophthalmology/optometry visit for glaucoma screening and checkup Recommended yearly dental visit for hygiene and checkup  Vaccinations: Influenza vaccine: completed  Pneumococcal vaccine: completed series  Tdap vaccine: 04/02/2014 Shingles vaccine: completed series     Advanced directives: none   Conditions/risks identified: none   Next appointment: CPE 10/03/2021  0830 Raymond Mann   Preventive Care 65 Years and Older, Male Preventive care refers to lifestyle choices and visits with your health care provider that can promote health and wellness. What does preventive care include? A yearly physical exam. This is also called an annual well check. Dental exams once or twice a year. Routine eye exams. Ask your health care provider how often you should have your eyes checked. Personal lifestyle choices, including: Daily care of your teeth and gums. Regular physical activity. Eating a healthy diet. Avoiding tobacco and drug use. Limiting alcohol use. Practicing safe sex. Taking low doses of aspirin every day. Taking vitamin and mineral supplements as recommended by your health care provider. What happens during an annual well check? The services and screenings done by your health care provider during your annual well check will depend on your age, overall health, lifestyle risk factors, and family history of disease. Counseling  Your health care provider may ask you questions about your: Alcohol use. Tobacco use. Drug use. Emotional well-being. Home and relationship well-being. Sexual activity. Eating habits. History of falls. Memory and ability to  understand (cognition). Work and work Astronomer. Screening  You may have the following tests or measurements: Height, weight, and BMI. Blood pressure. Lipid and cholesterol levels. These may be checked every 5 years, or more frequently if you are over 29 years old. Skin check. Lung cancer screening. You may have this screening every year starting at age 49 if you have a 30-pack-year history of smoking and currently smoke or have quit within the past 15 years. Fecal occult blood test (FOBT) of the stool. You may have this test every year starting at age 37. Flexible sigmoidoscopy or colonoscopy. You may have a sigmoidoscopy every 5 years or a colonoscopy every 10 years starting at age 3. Prostate cancer screening. Recommendations will vary depending on your family history and other risks. Hepatitis C blood test. Hepatitis B blood test. Sexually transmitted disease (STD) testing. Diabetes screening. This is done by checking your blood sugar (glucose) after you have not eaten for a while (fasting). You may have this done every 1-3 years. Abdominal aortic aneurysm (AAA) screening. You may need this if you are a current or former smoker. Osteoporosis. You may be screened starting at age 48 if you are at high risk. Talk with your health care provider about your test results, treatment options, and if necessary, the need for more tests. Vaccines  Your health care provider may recommend certain vaccines, such as: Influenza vaccine. This is recommended every year. Tetanus, diphtheria, and acellular pertussis (Tdap, Td) vaccine. You may need a Td booster every 10 years. Zoster vaccine. You may need this after age 49. Pneumococcal 13-valent conjugate (PCV13) vaccine. One dose is recommended after age 84. Pneumococcal polysaccharide (PPSV23) vaccine. One dose is recommended after age 54. Talk to your health care provider about which  screenings and vaccines you need and how often you need them. This  information is not intended to replace advice given to you by your health care provider. Make sure you discuss any questions you have with your health care provider. Document Released: 10/21/2015 Document Revised: 06/13/2016 Document Reviewed: 07/26/2015 Elsevier Interactive Patient Education  2017 [place] Prevention in the Home Falls can cause injuries. They can happen to people of all ages. There are many things you can do to make your home safe and to help prevent falls. What can I do on the outside of my home? Regularly fix the edges of walkways and driveways and fix any cracks. Remove anything that might make you trip as you walk through a door, such as a raised step or threshold. Trim any bushes or trees on the path to your home. Use bright outdoor lighting. Clear any walking paths of anything that might make someone trip, such as rocks or tools. Regularly check to see if handrails are loose or broken. Make sure that both sides of any steps have handrails. Any raised decks and porches should have guardrails on the edges. Have any leaves, snow, or ice cleared regularly. Use sand or salt on walking paths during winter. Clean up any spills in your garage right away. This includes oil or grease spills. What can I do in the bathroom? Use night lights. Install grab bars by the toilet and in the tub and shower. Do not use towel bars as grab bars. Use non-skid mats or decals in the tub or shower. If you need to sit down in the shower, use a plastic, non-slip stool. Keep the floor dry. Clean up any water that spills on the floor as soon as it happens. Remove soap buildup in the tub or shower regularly. Attach bath mats securely with double-sided non-slip rug tape. Do not have throw rugs and other things on the floor that can make you trip. What can I do in the bedroom? Use night lights. Make sure that you have a light by your bed that is easy to reach. Do not use any sheets or  blankets that are too big for your bed. They should not hang down onto the floor. Have a firm chair that has side arms. You can use this for support while you get dressed. Do not have throw rugs and other things on the floor that can make you trip. What can I do in the kitchen? Clean up any spills right away. Avoid walking on wet floors. Keep items that you use a lot in easy-to-reach places. If you need to reach something above you, use a strong step stool that has a grab bar. Keep electrical cords out of the way. Do not use floor polish or wax that makes floors slippery. If you must use wax, use non-skid floor wax. Do not have throw rugs and other things on the floor that can make you trip. What can I do with my stairs? Do not leave any items on the stairs. Make sure that there are handrails on both sides of the stairs and use them. Fix handrails that are broken or loose. Make sure that handrails are as long as the stairways. Check any carpeting to make sure that it is firmly attached to the stairs. Fix any carpet that is loose or worn. Avoid having throw rugs at the top or bottom of the stairs. If you do have throw rugs, attach them to the floor with carpet  tape. Make sure that you have a light switch at the top of the stairs and the bottom of the stairs. If you do not have them, ask someone to add them for you. What else can I do to help prevent falls? Wear shoes that: Do not have high heels. Have rubber bottoms. Are comfortable and fit you well. Are closed at the toe. Do not wear sandals. If you use a stepladder: Make sure that it is fully opened. Do not climb a closed stepladder. Make sure that both sides of the stepladder are locked into place. Ask someone to hold it for you, if possible. Clearly mark and make sure that you can see: Any grab bars or handrails. First and last steps. Where the edge of each step is. Use tools that help you move around (mobility aids) if they are  needed. These include: Canes. Walkers. Scooters. Crutches. Turn on the lights when you go into a dark area. Replace any light bulbs as soon as they burn out. Set up your furniture so you have a clear path. Avoid moving your furniture around. If any of your floors are uneven, fix them. If there are any pets around you, be aware of where they are. Review your medicines with your doctor. Some medicines can make you feel dizzy. This can increase your chance of falling. Ask your doctor what other things that you can do to help prevent falls. This information is not intended to replace advice given to you by your health care provider. Make sure you discuss any questions you have with your health care provider. Document Released: 07/21/2009 Document Revised: 03/01/2016 Document Reviewed: 10/29/2014 Elsevier Interactive Patient Education  2017 [employer].

## 2021-07-28 ENCOUNTER — Telehealth: Payer: Self-pay

## 2021-07-28 NOTE — Telephone Encounter (Addendum)
Pt states medication was last refilled on 06/28/21.  Pt also request pharmacy to be [facility] in [place], [place] on [address]. Pharmacy on file changed as requested.

## 2021-08-03 ENCOUNTER — Other Ambulatory Visit: Payer: Self-pay | Admitting: Adult Health

## 2021-08-03 DIAGNOSIS — E782 Mixed hyperlipidemia: Secondary | ICD-10-CM

## 2021-08-14 ENCOUNTER — Other Ambulatory Visit: Payer: Self-pay

## 2021-08-15 ENCOUNTER — Encounter: Payer: Self-pay | Admitting: Adult Health

## 2021-08-15 ENCOUNTER — Ambulatory Visit (INDEPENDENT_AMBULATORY_CARE_PROVIDER_SITE_OTHER): Payer: PPO | Admitting: Adult Health

## 2021-08-15 VITALS — BP 112/88 | HR 83 | Temp 98.2°F | Ht 71.25 in | Wt 229.0 lb

## 2021-08-15 DIAGNOSIS — K429 Umbilical hernia without obstruction or gangrene: Secondary | ICD-10-CM

## 2021-08-15 NOTE — Progress Notes (Signed)
Subjective:    Patient ID: Raymond Mann, male    DOB: [birth year], 65 y.o.   MRN: [id number]  HPI 65 year old male who  has a past medical history of Cataract of right eye, ED (erectile dysfunction), Essential hypertension, Fatty liver, Hyperlipidemia, Myopia, Ocular disease, Pseudophakia of left eye, Retinal detachment, Shingles, and Sight deterioration.  He presents to the office today for painful umbilical hernia. Reports that over the last three months he has had more pain with reduction of umbilical hernia and when picking up his your grandson.   Has intermittent painful bowel movements.     Review of Systems See HPI   Past Medical History:  Diagnosis Date   Cataract of right eye    ED (erectile dysfunction)    Essential hypertension    Fatty liver    Hyperlipidemia    Myopia    Ocular disease    Pseudophakia of left eye    Retinal detachment    both eyes   Shingles    Sight deterioration    right eye    Social History   Socioeconomic History   Marital status: Married    Spouse name: Not on file   Number of children: 2   Years of education: Not on file   Highest education level: Bachelor's degree (e.g., BA, AB, BS)  Occupational History   Not on file  Tobacco Use   Smoking status: Never   Smokeless tobacco: Never  Substance and Sexual Activity   Alcohol use: Yes    Comment: occasionally   Drug use: No   Sexual activity: Not on file  Other Topics Concern   Not on file  Social History 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    One grandchild       Social Determinants of Health   Financial Resource Strain: Low Risk    Difficulty of Paying Living Expenses: Not hard at all  Food Insecurity: No Food Insecurity   Worried About Programme researcher, broadcasting/film/video in the Last Year: Never true   Barista in the Last Year: Never true  Transportation Needs: No Transportation Needs   Lack of Transportation (Medical): No   Lack of  Transportation (Non-Medical): No  Physical Activity: Sufficiently Active   Days of Exercise per Week: 5 days   Minutes of Exercise per Session: 30 min  Stress: No Stress Concern Present   Feeling of Stress : Not at all  Social Connections: Socially Integrated   Frequency of Communication with Friends and Family: Twice a week   Frequency of Social Gatherings with Friends and Family: Twice a week   Attends Religious Services: More than 4 times per year   Active Member of [employer] or Organizations: Yes   Attends Engineer, structural: More than 4 times per year   Marital Status: Married  Catering manager Violence: Not At Risk   Fear of Current or Ex-Partner: No   Emotionally Abused: No   Physically Abused: No   Sexually Abused: No    Past Surgical History:  Procedure Laterality Date   bilateral retinal detachment     x2 each eye   CAPSULOTOMY  2001   left eye   cryopexy  1995   right eye   scleral buckling  1995   left eye   VITRECTOMY  1995   both eyes    Family History  Problem Relation  Age of Onset   Stroke Father    Heart attack Father    Dementia Mother    Stomach cancer Mother 47   Retinal detachment Brother     Allergies  Allergen Reactions   Bee Venom Swelling   Lisinopril Cough    Current Outpatient Medications on File Prior to Visit  Medication Sig Dispense Refill   amLODipine (NORVASC) 5 MG tablet TAKE ONE TABLET BY MOUTH DAILY 90 tablet 2   betamethasone dipropionate 0.05 % cream Apply topically.     ketoconazole (NIZORAL) 2 % cream      latanoprost (XALATAN) 0.005 % ophthalmic solution Place 1 drop into the left eye nightly.     pravastatin (PRAVACHOL) 20 MG tablet TAKE 1 Tablet BY MOUTH ONCE DAILY at bedtime 90 tablet PRN   sildenafil (REVATIO) 20 MG tablet Take 1-5 tablets PRN 90 tablet 2   No current facility-administered medications on file prior to visit.    BP 112/88   Pulse 83   Temp 98.2 F (36.8 C) (Oral)   Ht 5' 11.25" (1.81 m)    Wt 229 lb (103.9 kg)   SpO2 97%   BMI 31.72 kg/m       Objective:   Physical Exam Vitals and nursing note reviewed.  Constitutional:      Appearance: Normal appearance.  Abdominal:     General: Abdomen is flat. Bowel sounds are normal.     Palpations: Abdomen is soft.     Tenderness: There is abdominal tenderness.     Hernia: A hernia is present.  Skin:    General: Skin is warm and dry.  Neurological:     General: No focal deficit present.     Mental Status: He is alert and oriented to person, place, and time.  Psychiatric:        Mood and Affect: Mood normal.        Behavior: Behavior normal.        Thought Content: Thought content normal.        Judgment: Judgment normal.      Assessment & Plan:  1. Umbilical hernia without obstruction and without gangrene - Will refer to general surgery  - Red flags reviewed - Add fiber supplement to diet to help with bowel movements - Ambulatory referral to General Surgery  [name], NP

## 2021-08-15 NOTE — Patient Instructions (Signed)
It was great seeing you today   I am going to refer you to general surgery for further evaluation of the hernia.   Refrain from heavy lifting greater than 15 -20 pounds

## 2021-09-27 ENCOUNTER — Other Ambulatory Visit: Payer: Self-pay | Admitting: Adult Health

## 2021-09-27 DIAGNOSIS — E782 Mixed hyperlipidemia: Secondary | ICD-10-CM

## 2021-10-03 ENCOUNTER — Encounter: Payer: PPO | Admitting: Adult Health

## 2021-10-16 DIAGNOSIS — I10 Essential (primary) hypertension: Secondary | ICD-10-CM | POA: Diagnosis not present

## 2021-10-16 DIAGNOSIS — K429 Umbilical hernia without obstruction or gangrene: Secondary | ICD-10-CM | POA: Diagnosis not present

## 2021-10-26 ENCOUNTER — Ambulatory Visit (INDEPENDENT_AMBULATORY_CARE_PROVIDER_SITE_OTHER): Payer: PPO | Admitting: Adult Health

## 2021-10-26 ENCOUNTER — Encounter: Payer: Self-pay | Admitting: Adult Health

## 2021-10-26 VITALS — BP 100/70 | HR 88 | Temp 98.6°F | Ht 71.0 in | Wt 220.0 lb

## 2021-10-26 DIAGNOSIS — N529 Male erectile dysfunction, unspecified: Secondary | ICD-10-CM | POA: Diagnosis not present

## 2021-10-26 DIAGNOSIS — E782 Mixed hyperlipidemia: Secondary | ICD-10-CM | POA: Diagnosis not present

## 2021-10-26 DIAGNOSIS — Z0000 Encounter for general adult medical examination without abnormal findings: Secondary | ICD-10-CM

## 2021-10-26 DIAGNOSIS — I10 Essential (primary) hypertension: Secondary | ICD-10-CM | POA: Diagnosis not present

## 2021-10-26 DIAGNOSIS — Z125 Encounter for screening for malignant neoplasm of prostate: Secondary | ICD-10-CM

## 2021-10-26 LAB — CBC WITH DIFFERENTIAL/PLATELET
Basophils Absolute: 0 10*3/uL (ref 0.0–0.1)
Basophils Relative: 0.7 % (ref 0.0–3.0)
Eosinophils Absolute: 0.1 10*3/uL (ref 0.0–0.7)
Eosinophils Relative: 2 % (ref 0.0–5.0)
HCT: 46.9 % (ref 39.0–52.0)
Hemoglobin: 16 g/dL (ref 13.0–17.0)
Lymphocytes Relative: 29.7 % (ref 12.0–46.0)
Lymphs Abs: 1.4 10*3/uL (ref 0.7–4.0)
MCHC: 34.1 g/dL (ref 30.0–36.0)
MCV: 89.9 fl (ref 78.0–100.0)
Monocytes Absolute: 0.4 10*3/uL (ref 0.1–1.0)
Monocytes Relative: 8.9 % (ref 3.0–12.0)
Neutro Abs: 2.7 10*3/uL (ref 1.4–7.7)
Neutrophils Relative %: 58.7 % (ref 43.0–77.0)
Platelets: 220 10*3/uL (ref 150.0–400.0)
RBC: 5.22 Mil/uL (ref 4.22–5.81)
RDW: 13.5 % (ref 11.5–15.5)
WBC: 4.6 10*3/uL (ref 4.0–10.5)

## 2021-10-26 LAB — COMPREHENSIVE METABOLIC PANEL
ALT: 20 U/L (ref 0–53)
AST: 15 U/L (ref 0–37)
Albumin: 4.5 g/dL (ref 3.5–5.2)
Alkaline Phosphatase: 75 U/L (ref 39–117)
BUN: 15 mg/dL (ref 6–23)
CO2: 27 mEq/L (ref 19–32)
Calcium: 9.1 mg/dL (ref 8.4–10.5)
Chloride: 105 mEq/L (ref 96–112)
Creatinine, Ser: 1.07 mg/dL (ref 0.40–1.50)
GFR: 71.91 mL/min (ref >60.00)
Glucose, Bld: 106 mg/dL — ABNORMAL HIGH (ref 70–99)
Potassium: 4.1 mEq/L (ref 3.5–5.1)
Sodium: 141 mEq/L (ref 135–145)
Total Bilirubin: 0.9 mg/dL (ref 0.2–1.2)
Total Protein: 6.9 g/dL (ref 6.0–8.3)

## 2021-10-26 LAB — LIPID PANEL
Cholesterol: 143 mg/dL (ref 0–200)
HDL: 38.2 mg/dL — ABNORMAL LOW (ref >39.00)
LDL Cholesterol: 86 mg/dL (ref 0–99)
NonHDL: 104.8
Total CHOL/HDL Ratio: 4
Triglycerides: 94 mg/dL (ref 0.0–149.0)
VLDL: 18.8 mg/dL (ref 0.0–40.0)

## 2021-10-26 LAB — TSH: TSH: 2.92 u[IU]/mL (ref 0.35–5.50)

## 2021-10-26 LAB — PSA: PSA: 1.37 ng/mL (ref 0.10–4.00)

## 2021-10-26 LAB — HEMOGLOBIN A1C: Hgb A1c MFr Bld: 5.8 % (ref 4.6–6.5)

## 2021-10-26 NOTE — Progress Notes (Signed)
Subjective:    Patient ID: Raymond Mann, male    DOB: 09-15-1956, 66 y.o.   MRN: [id number]  HPI Patient presents for yearly preventative medicine examination. He is a pleasant 66 year old male who  has a past medical history of Cataract of right eye, ED (erectile dysfunction), Essential hypertension, Fatty liver, Hyperlipidemia, Myopia, Ocular disease, Pseudophakia of left eye, Retinal detachment, Shingles, and Sight deterioration.  Hypertension-takes Norvasc 5 mg daily.  He denies dizziness, lightheadedness, chest pain, or shortness of breath BP Readings from Last 3 Encounters:  10/26/21 100/70  08/15/21 112/88  09/29/20 126/82   Hyperlipidemia-is controlled with pravastatin 20 mg daily.  He denies myalgia or fatigue Lab Results  Component Value Date   CHOL 184 09/26/2020   HDL 40 09/26/2020   LDLCALC 117 (H) 09/26/2020   TRIG 157 (H) 09/26/2020   CHOLHDL 4.6 09/26/2020   Erectile dysfunction-takes sildenafil PRN   Umbilical hernia - is having surgical repair in Feb 2023. Does have pain in abdomen, especially with change in positions and lifting objects  All immunizations and health maintenance protocols were reviewed with the patient and needed orders were placed.  Appropriate screening laboratory values were ordered for the patient including screening of hyperlipidemia, renal function and hepatic function. If indicated by BPH, a PSA was ordered.  Medication reconciliation,  past medical history, social history, problem list and allergies were reviewed in detail with the patient  Goals were established with regard to weight loss, exercise, and  diet in compliance with medications Wt Readings from Last 3 Encounters:  10/26/21 220 lb (99.8 kg)  08/15/21 229 lb (103.9 kg)  09/29/20 232 lb 9.6 oz (105.5 kg)    Review of Systems  Constitutional: Negative.   HENT: Negative.    Eyes: Negative.   Respiratory: Negative.    Cardiovascular: Negative.   Gastrointestinal:   Positive for abdominal pain.  Endocrine: Negative.   Genitourinary: Negative.   Musculoskeletal: Negative.   Skin: Negative.   Allergic/Immunologic: Negative.   Neurological: Negative.   Hematological: Negative.   Psychiatric/Behavioral: Negative.    All other systems reviewed and are negative.  Past Medical History:  Diagnosis Date   Cataract of right eye    ED (erectile dysfunction)    Essential hypertension    Fatty liver    Hyperlipidemia    Myopia    Ocular disease    Pseudophakia of left eye    Retinal detachment    both eyes   Shingles    Sight deterioration    right eye    Social History   Socioeconomic History   Marital status: Married    Spouse name: Not on file   Number of children: 2   Years of education: Not on file   Highest education level: Bachelor's degree (e.g., BA, AB, BS)  Occupational History   Not on file  Tobacco Use   Smoking status: Never   Smokeless tobacco: Never  Substance and Sexual Activity   Alcohol use: Yes    Comment: occasionally   Drug use: No   Sexual activity: Not on file  Other Topics Concern   Not on file  Social History 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    One grandchild       Social Determinants of Health   Financial Resource Strain: Low Risk    Difficulty of Paying Living Expenses: Not hard at all  Food  Insecurity: No Food Insecurity   Worried About Programme researcher, broadcasting/film/video in the Last Year: Never true   Ran Out of Food in the Last Year: Never true  Transportation Needs: No Transportation Needs   Lack of Transportation (Medical): No   Lack of Transportation (Non-Medical): No  Physical Activity: Sufficiently Active   Days of Exercise per Week: 5 days   Minutes of Exercise per Session: 30 min  Stress: No Stress Concern Present   Feeling of Stress : Not at all  Social Connections: Socially Integrated   Frequency of Communication with Friends and Family: Twice a week    Frequency of Social Gatherings with Friends and Family: Twice a week   Attends Religious Services: More than 4 times per year   Active Member of [employer] or Organizations: Yes   Attends Engineer, structural: More than 4 times per year   Marital Status: Married  Catering manager Violence: Not At Risk   Fear of Current or Ex-Partner: No   Emotionally Abused: No   Physically Abused: No   Sexually Abused: No    Past Surgical History:  Procedure Laterality Date   bilateral retinal detachment     x2 each eye   CAPSULOTOMY  2001   left eye   cryopexy  1995   right eye   scleral buckling  1995   left eye   VITRECTOMY  1995   both eyes    Family History  Problem Relation Age of Onset   Stroke Father    Heart attack Father    Dementia Mother    Stomach cancer Mother 28   Retinal detachment Brother     Allergies  Allergen Reactions   Bee Venom Swelling   Lisinopril Cough   Nutritional Supplements Other (See Comments)    Itching, possibly    Current Outpatient Medications on File Prior to Visit  Medication Sig Dispense Refill   amLODipine (NORVASC) 5 MG tablet TAKE ONE TABLET BY MOUTH DAILY 90 tablet 2   betamethasone dipropionate 0.05 % cream Apply topically.     latanoprost (XALATAN) 0.005 % ophthalmic solution Place 1 drop into the left eye nightly.     pravastatin (PRAVACHOL) 20 MG tablet TAKE ONE TABLET BY MOUTH DAILY 90 tablet 0   sildenafil (REVATIO) 20 MG tablet Take 1-5 tablets PRN 90 tablet 2   No current facility-administered medications on file prior to visit.    BP 100/70    Pulse 88    Temp 98.6 F (37 C) (Oral)    Ht 5\' 11"  (1.803 m)    Wt 220 lb (99.8 kg)    SpO2 96%    BMI 30.68 kg/m        Objective:   Physical Exam Vitals and nursing note reviewed.  Constitutional:      General: He is not in acute distress.    Appearance: Normal appearance. He is well-developed and normal weight.  HENT:     Head: Normocephalic and atraumatic.     Right  Ear: Tympanic membrane, ear canal and external ear normal. There is no impacted cerumen.     Left Ear: Tympanic membrane, ear canal and external ear normal. There is no impacted cerumen.     Nose: Nose normal. No congestion or rhinorrhea.     Mouth/Throat:     Mouth: Mucous membranes are moist.     Pharynx: Oropharynx is clear. No oropharyngeal exudate or posterior oropharyngeal erythema.  Eyes:     General:  No scleral icterus.       Right eye: No discharge.        Left eye: No discharge.     Extraocular Movements: Extraocular movements intact.     Conjunctiva/sclera: Conjunctivae normal.     Pupils: Pupils are equal, round, and reactive to light.  Neck:     Vascular: No carotid bruit.     Trachea: No tracheal deviation.  Cardiovascular:     Rate and Rhythm: Normal rate and regular rhythm.     Pulses: Normal pulses.     Heart sounds: Normal heart sounds. No murmur heard.   No friction rub. No gallop.  Pulmonary:     Effort: Pulmonary effort is normal. No respiratory distress.     Breath sounds: Normal breath sounds. No stridor. No wheezing, rhonchi or rales.  Chest:     Chest wall: No tenderness.  Abdominal:     General: Bowel sounds are normal. There is no distension.     Palpations: Abdomen is soft. There is no mass.     Tenderness: There is abdominal tenderness (umbilical area). There is no right CVA tenderness, left CVA tenderness, guarding or rebound.     Hernia: A hernia is present. Hernia is present in the umbilical area.  Musculoskeletal:        General: No swelling, tenderness, deformity or signs of injury. Normal range of motion.     Right lower leg: No edema.     Left lower leg: No edema.  Lymphadenopathy:     Cervical: No cervical adenopathy.  Skin:    General: Skin is warm and dry.     Capillary Refill: Capillary refill takes less than 2 seconds.     Coloration: Skin is not jaundiced or pale.     Findings: No bruising, erythema, lesion or rash.  Neurological:      General: No focal deficit present.     Mental Status: He is alert and oriented to person, place, and time.     Cranial Nerves: No cranial nerve deficit.     Sensory: No sensory deficit.     Motor: No weakness.     Coordination: Coordination normal.     Gait: Gait normal.     Deep Tendon Reflexes: Reflexes normal.  Psychiatric:        Mood and Affect: Mood normal.        Behavior: Behavior normal.        Thought Content: Thought content normal.        Judgment: Judgment normal.      Assessment & Plan:  1. Routine general medical examination at a health care facility - Continue weight loss  - Follow up in one year or sooner if needed - CBC with Differential/Platelet; Future - Comprehensive metabolic panel; Future - Lipid panel; Future - TSH; Future - Hemoglobin A1c; Future - Hemoglobin A1c - TSH - Lipid panel - Comprehensive metabolic panel - CBC with Differential/Platelet  2. Essential hypertension - Controlled. No change in medications  - CBC with Differential/Platelet; Future - Comprehensive metabolic panel; Future - Lipid panel; Future - TSH; Future - TSH - Lipid panel - Comprehensive metabolic panel - CBC with Differential/Platelet  3. Mixed hyperlipidemia - Consider increase in statin  - CBC with Differential/Platelet; Future - Comprehensive metabolic panel; Future - Lipid panel; Future - TSH; Future - TSH - Lipid panel - Comprehensive metabolic panel - CBC with Differential/Platelet  4. Erectile dysfunction, unspecified erectile dysfunction type  - CBC with Differential/Platelet;  Future - Comprehensive metabolic panel; Future - Lipid panel; Future - TSH; Future - TSH - Lipid panel - Comprehensive metabolic panel - CBC with Differential/Platelet  5. Prostate cancer screening  - PSA; Future - PSA  [name], NP

## 2021-11-13 ENCOUNTER — Telehealth: Payer: Self-pay | Admitting: Adult Health

## 2021-11-13 ENCOUNTER — Telehealth (INDEPENDENT_AMBULATORY_CARE_PROVIDER_SITE_OTHER): Payer: PPO | Admitting: Family Medicine

## 2021-11-13 ENCOUNTER — Other Ambulatory Visit: Payer: Self-pay

## 2021-11-13 ENCOUNTER — Encounter: Payer: Self-pay | Admitting: Family Medicine

## 2021-11-13 VITALS — Ht 71.0 in

## 2021-11-13 DIAGNOSIS — J3489 Other specified disorders of nose and nasal sinuses: Secondary | ICD-10-CM | POA: Diagnosis not present

## 2021-11-13 DIAGNOSIS — J069 Acute upper respiratory infection, unspecified: Secondary | ICD-10-CM | POA: Diagnosis not present

## 2021-11-13 MED ORDER — IPRATROPIUM BROMIDE 0.06 % NA SOLN: 2.0000 | Freq: Four times a day (QID) | NASAL | 0 refills | Status: DC

## 2021-11-13 MED ORDER — AMOXICILLIN-POT CLAVULANATE 875-125 MG PO TABS
1.0000 | ORAL_TABLET | Freq: Two times a day (BID) | ORAL | 0 refills | Status: AC
Start: 2021-11-13 — End: 2021-11-20

## 2021-11-13 NOTE — Progress Notes (Signed)
Virtual Visit via Video Note I connected with Raymond Mann on 11/13/21 by a video enabled telemedicine application and verified that I am speaking with the correct person using two identifiers.  Location patient: home Location provider:work office Persons participating in the virtual visit: patient, provider  I discussed the limitations of evaluation and management by telemedicine and the availability of in person appointments. The patient expressed understanding and agreed to proceed.  Chief Complaint  Patient presents with   Nasal Congestion    Blood tinge, OTC medications not helping. Scheduled for hernia surgery 2/13. No fever. Eyes watering/crusted.    Sore Throat        Ear Pain   HPI: Raymond Mann is a 66 yo male with history of hyperlipidemia and hypertension complaining of respiratory symptoms as described above. Symptoms started about 5 days ago. Chills, fatigue,sore throat,frontal headache, nasal congestion, rhinorrhea, facial pain,and cough. Body aches and chills until 2 days ago. Nasal congestion is worse at night when lying down.  Negative for fever, visual changes, CP, dyspnea, wheezing, abdominal pain, nausea, vomiting, urinary symptoms, or skin rash. Productive cough with yellowish/greenish sputum, denies hemoptysis. Crusty material around right eye in the morning when he first wakes up, bilateral epiphora. Negative for conjunctival erythema or purulent discharge.  Right earache, he has not noted hearing changes or ear drainage. Occasional dysphonia, no stridor.  He had diarrhea until 3 days ago.  He has tried Warden/ranger. Flonase nasal spray caused nosebleeds and dry nasal mucosa.  COVID-19 home testing x3 negative.  He is concerned about infectious process. He is grandson had a URI recently and he was treated with amoxicillin.  ROS: See pertinent positives and negatives per HPI.  Past Medical History:  Diagnosis Date   Cataract of right eye    ED  (erectile dysfunction)    Essential hypertension    Fatty liver    Hyperlipidemia    Myopia    Ocular disease    Pseudophakia of left eye    Retinal detachment    both eyes   Shingles    Sight deterioration    right eye   Past Surgical History:  Procedure Laterality Date   bilateral retinal detachment     x2 each eye   CAPSULOTOMY  2001   left eye   cryopexy  1995   right eye   scleral buckling  1995   left eye   VITRECTOMY  1995   both eyes    Family History  Problem Relation Age of Onset   Stroke Father    Heart attack Father    Dementia Mother    Stomach cancer Mother 33   Retinal detachment Brother     Social History   Socioeconomic History   Marital status: Married    Spouse name: Not on file   Number of children: 2   Years of education: Not on file   Highest education level: Bachelor's degree (e.g., BA, AB, BS)  Occupational History   Not on file  Tobacco Use   Smoking status: Never   Smokeless tobacco: Never  Substance and Sexual Activity   Alcohol use: Yes    Comment: occasionally   Drug use: No   Sexual activity: Not on file  Other Topics Concern   Not on file  Social History 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    One grandchild  Social Determinants of Health   Financial Resource Strain: Low Risk    Difficulty of Paying Living Expenses: Not hard at all  Food Insecurity: No Food Insecurity   Worried About Programme researcher, broadcasting/film/video in the Last Year: Never true   Ran Out of Food in the Last Year: Never true  Transportation Needs: No Transportation Needs   Lack of Transportation (Medical): No   Lack of Transportation (Non-Medical): No  Physical Activity: Sufficiently Active   Days of Exercise per Week: 5 days   Minutes of Exercise per Session: 30 min  Stress: No Stress Concern Present   Feeling of Stress : Not at all  Social Connections: Socially Integrated   Frequency of Communication with Friends  and Family: Twice a week   Frequency of Social Gatherings with Friends and Family: Twice a week   Attends Religious Services: More than 4 times per year   Active Member of [employer] or Organizations: Yes   Attends Engineer, structural: More than 4 times per year   Marital Status: Married  Catering manager Violence: Not At Risk   Fear of Current or Ex-Partner: No   Emotionally Abused: No   Physically Abused: No   Sexually Abused: No    Current Outpatient Medications:    amLODipine (NORVASC) 5 MG tablet, TAKE ONE TABLET BY MOUTH DAILY, Disp: 90 tablet, Rfl: 2   betamethasone dipropionate 0.05 % cream, Apply topically., Disp: , Rfl:    ipratropium (ATROVENT) 0.06 % nasal spray, Place 2 sprays into both nostrils 4 (four) times daily., Disp: 15 mL, Rfl: 0   latanoprost (XALATAN) 0.005 % ophthalmic solution, Place 1 drop into the left eye nightly., Disp: , Rfl:    pravastatin (PRAVACHOL) 20 MG tablet, TAKE ONE TABLET BY MOUTH DAILY, Disp: 90 tablet, Rfl: 0   sildenafil (REVATIO) 20 MG tablet, Take 1-5 tablets PRN, Disp: 90 tablet, Rfl: 2  EXAM:  VITALS per patient if applicable:Ht 5\' 11"  (1.803 m)    BMI 30.68 kg/m   GENERAL: alert, oriented, appears well and in no acute distress  HEENT: atraumatic, conjunctiva clear, no obvious abnormalities on inspection of external nose and ears Nasal congestion, no dysphonia. Mild ear area with pulling ear lobe, right ear. Mild tenderness when pressing maxillary sinuses.  NECK: normal movements of the head and neck  LUNGS: on inspection no signs of respiratory distress, breathing rate appears normal, no obvious gross SOB, gasping or wheezing  CV: no obvious cyanosis  MS: moves all visible extremities without noticeable abnormality  PSYCH/NEURO: pleasant and cooperative, no obvious depression or anxiety, speech and thought processing grossly intact  ASSESSMENT AND PLAN:  Discussed the following assessment and plan:  URI, acute - Plan:  ipratropium (ATROVENT) 0.06 % nasal spray Symptoms suggests a viral etiology, I explained that symptomatic treatment is recommended;so I do not think abx is needed at this time. Instructed to monitor for signs of complications, including new onset of fever among some, clearly instructed about warning signs. Atrovent nasal spray to help with nasal congestion as well as nasal saline irrigations as needed. Coolmist humidifier and Vicks VapoRub on chest and on tip of nose may help with nasal congestion. Throat lozenges for sore throat. He can return to work as far as he has not fever. I also explained that cough and nasal congestion can last a few days and sometimes weeks. F/U as needed.  Sinus pressure - Plan: amoxicillin-clavulanate (AUGMENTIN) 875-125 MG tablet I do not think abx treatment will  help at this time.I sent a Rx for Augmentin, which he can consider starting in 2-3 days if symptoms have not improved any. Explained that abx will not help with congestion or mucus production. Some side effects discussed. Caution of OTC cold meds/decongestants because Hx of HTN and planning gon having surgery in a few days.  We discussed possible serious and likely etiologies, options for evaluation and workup, limitations of telemedicine visit vs in person visit, treatment, treatment risks and precautions. The patient was advised to call back or seek an in-person evaluation if the symptoms worsen or if the condition fails to improve as anticipated. I discussed the assessment and treatment plan with the patient. The patient was provided an opportunity to ask questions and all were answered. The patient agreed with the plan and demonstrated an understanding of the instructions.  Return if symptoms worsen or fail to improve.  Yasiel Goyne G. [place], MD  [facility]. [place] office.

## 2021-11-13 NOTE — Telephone Encounter (Signed)
Patient calling in with respiratory symptoms: Shortness of breath, chest pain, palpitations or other red words send to Triage  Does the patient have a fever over 100, cough, congestion, sore throat, runny nose, lost of taste/smell (please list symptoms that patient has)?ear pain, sore throat, nasal congestion  What date did symptoms start?11-11-2021 (If over 5 days ago, pt may be scheduled for in person visit)  Have you tested for Covid in the last 5 days? Yes   If yes, was it positive []  OR negative [x] ? If positive in the last 5 days, please schedule virtual visit now. If negative, schedule for an in person OV with the next available provider if PCP has no openings. Please also let patient know they will be tested again (follow the script below)  "you will have to arrive prior to your appt time to be Covid tested. Please park in back of office at the [facility] & call [phone] to let the staff know you have arrived. A staff member will meet you at your car to do a rapid covid test. Once the test has resulted you will be notified by phone of your results to determine if appt will remain an in person visit or be converted to a virtual/phone visit. If you arrive less than before your appt time, your visit will be automatically converted to virtual & any recommended testing will happen AFTER the visit." Pt has virtual with dr [phone] on 11-13-2021 4 pm  THINGS TO REMEMBER  If no availability for virtual visit in office,  please schedule another [place] office  If no availability at another [place] office, please instruct patient that they can schedule an evisit or virtual visit through their mychart account. Visits up to 8pm  patients can be seen in office 5 days after positive COVID test

## 2021-12-19 DIAGNOSIS — K429 Umbilical hernia without obstruction or gangrene: Secondary | ICD-10-CM | POA: Diagnosis not present

## 2021-12-22 DIAGNOSIS — H40053 Ocular hypertension, bilateral: Secondary | ICD-10-CM | POA: Diagnosis not present

## 2022-01-03 ENCOUNTER — Other Ambulatory Visit: Payer: Self-pay | Admitting: Adult Health

## 2022-01-03 DIAGNOSIS — I10 Essential (primary) hypertension: Secondary | ICD-10-CM

## 2022-01-11 DIAGNOSIS — Z9889 Other specified postprocedural states: Secondary | ICD-10-CM | POA: Diagnosis not present

## 2022-01-11 DIAGNOSIS — Z8719 Personal history of other diseases of the digestive system: Secondary | ICD-10-CM | POA: Diagnosis not present

## 2022-02-02 DIAGNOSIS — X32XXXD Exposure to sunlight, subsequent encounter: Secondary | ICD-10-CM | POA: Diagnosis not present

## 2022-02-02 DIAGNOSIS — L570 Actinic keratosis: Secondary | ICD-10-CM | POA: Diagnosis not present

## 2022-02-02 DIAGNOSIS — L308 Other specified dermatitis: Secondary | ICD-10-CM | POA: Diagnosis not present

## 2022-05-17 DIAGNOSIS — M25561 Pain in right knee: Secondary | ICD-10-CM | POA: Diagnosis not present

## 2022-06-19 DIAGNOSIS — S92912A Unspecified fracture of left toe(s), initial encounter for closed fracture: Secondary | ICD-10-CM | POA: Diagnosis not present

## 2022-06-19 DIAGNOSIS — M25561 Pain in right knee: Secondary | ICD-10-CM | POA: Diagnosis not present

## 2022-07-20 ENCOUNTER — Ambulatory Visit (INDEPENDENT_AMBULATORY_CARE_PROVIDER_SITE_OTHER): Payer: PPO | Admitting: Adult Health

## 2022-07-20 ENCOUNTER — Encounter: Payer: Self-pay | Admitting: Adult Health

## 2022-07-20 VITALS — BP 100/70 | HR 88 | Temp 97.7°F | Ht 71.0 in | Wt 226.0 lb

## 2022-07-20 DIAGNOSIS — N529 Male erectile dysfunction, unspecified: Secondary | ICD-10-CM | POA: Diagnosis not present

## 2022-07-20 DIAGNOSIS — H6001 Abscess of right external ear: Secondary | ICD-10-CM | POA: Diagnosis not present

## 2022-07-20 DIAGNOSIS — Z23 Encounter for immunization: Secondary | ICD-10-CM | POA: Diagnosis not present

## 2022-07-20 MED ORDER — SILDENAFIL CITRATE 20 MG PO TABS: ORAL_TABLET | ORAL | 2 refills | Status: AC

## 2022-07-20 MED ORDER — DOXYCYCLINE HYCLATE 100 MG PO CAPS: 100.0000 mg | ORAL_CAPSULE | Freq: Two times a day (BID) | ORAL | 0 refills | Status: DC

## 2022-07-20 NOTE — Progress Notes (Signed)
Subjective:    Patient ID: Raymond Mann, male    DOB: Jul 30, 1956, 66 y.o.   MRN: [id number]  HPI 66 year old male who  has a past medical history of Cataract of right eye, ED (erectile dysfunction), Essential hypertension, Fatty liver, Hyperlipidemia, Myopia, Ocular disease, Pseudophakia of left eye, Retinal detachment, Shingles, and Sight deterioration.  He presents to the office today for an acute visit of  right sided ear pain x 1 week. Reports that he was down in [place] last week and was in a hot tub and pool and believes he got an ear infection. Painful when he puts his finger in his ear and when he lays his ear on a pillow at night.  Denies drainage from the ear  He also needs a refill of viagra   Review of Systems See HPI   Past Medical History:  Diagnosis Date   Cataract of right eye    ED (erectile dysfunction)    Essential hypertension    Fatty liver    Hyperlipidemia    Myopia    Ocular disease    Pseudophakia of left eye    Retinal detachment    both eyes   Shingles    Sight deterioration    right eye    Social History   Socioeconomic History   Marital status: Married    Spouse name: Not on file   Number of children: 2   Years of education: Not on file   Highest education level: Bachelor's degree (e.g., BA, AB, BS)  Occupational History   Not on file  Tobacco Use   Smoking status: Never   Smokeless tobacco: Never  Substance and Sexual Activity   Alcohol use: Yes    Comment: occasionally   Drug use: No   Sexual activity: Not on file  Other Topics Concern   Not on file  Social History 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    One grandchild       Social Determinants of Health   Financial Resource Strain: Low Risk  (07/26/2021)   Overall Financial Resource Strain (CARDIA)    Difficulty of Paying Living Expenses: Not hard at all  Food Insecurity: No Food Insecurity (07/26/2021)   Hunger Vital Sign    Worried  About Running Out of Food in the Last Year: Never true    Ran Out of Food in the Last Year: Never true  Transportation Needs: No Transportation Needs (07/26/2021)   PRAPARE - Hydrologist (Medical): No    Lack of Transportation (Non-Medical): No  Physical Activity: Sufficiently Active (07/26/2021)   Exercise Vital Sign    Days of Exercise per Week: 5 days    Minutes of Exercise per Session: 30 min  Stress: No Stress Concern Present (07/26/2021)   [place]    Feeling of Stress : Not at all  Social Connections: [place] (07/26/2021)   Social Connection and Isolation Panel [NHANES]    Frequency of Communication with Friends and Family: Twice a week    Frequency of Social Gatherings with Friends and Family: Twice a week    Attends Religious Services: More than 4 times per year    Active Member of [employer] or Organizations: Yes    Attends Music therapist: More than 4 times per year    Marital Status: Married  Intimate  Partner Violence: Not At Risk (07/26/2021)   Humiliation, Afraid, Rape, and Kick questionnaire    Fear of Current or Ex-Partner: No    Emotionally Abused: No    Physically Abused: No    Sexually Abused: No    Past Surgical History:  Procedure Laterality Date   bilateral retinal detachment     x2 each eye   CAPSULOTOMY  2001   left eye   cryopexy  1995   right eye   scleral buckling  1995   left eye   VITRECTOMY  1995   both eyes    Family History  Problem Relation Age of Onset   Stroke Father    Heart attack Father    Dementia Mother    Stomach cancer Mother 22   Retinal detachment Brother     Allergies  Allergen Reactions   Bee Venom Swelling   Lisinopril Cough   Nutritional Supplements Other (See Comments)    Itching, possibly    Current Outpatient Medications on File Prior to Visit  Medication Sig Dispense Refill   amLODipine  (NORVASC) 5 MG tablet TAKE ONE TABLET BY MOUTH DAILY 90 tablet 1   betamethasone dipropionate 0.05 % cream Apply topically.     latanoprost (XALATAN) 0.005 % ophthalmic solution Place 1 drop into the left eye nightly.     pravastatin (PRAVACHOL) 20 MG tablet TAKE ONE TABLET BY MOUTH DAILY 90 tablet 0   sildenafil (REVATIO) 20 MG tablet Take 1-5 tablets PRN 90 tablet 2   No current facility-administered medications on file prior to visit.    BP 100/70   Pulse 88   Temp 97.7 F (36.5 C) (Oral)   Ht 5\' 11"  (1.803 m)   Wt 226 lb (102.5 kg)   SpO2 98%   BMI 31.52 kg/m       Objective:   Physical Exam Vitals and nursing note reviewed.  Constitutional:      Appearance: Normal appearance.  HENT:     Right Ear: Hearing, tympanic membrane and ear canal normal. Swelling and tenderness present. No mastoid tenderness. Tympanic membrane is not injected or erythematous.     Left Ear: Hearing, tympanic membrane, ear canal and external ear normal.     Ears:      Comments: Small abscess noted on inside of tragus  Musculoskeletal:        General: Normal range of motion.  Skin:    General: Skin is warm and dry.     Capillary Refill: Capillary refill takes less than 2 seconds.  Neurological:     General: No focal deficit present.     Mental Status: He is alert. Mental status is at baseline.  Psychiatric:        Mood and Affect: Mood normal.        Behavior: Behavior normal.        Thought Content: Thought content normal.        Judgment: Judgment normal.       Assessment & Plan:  1. Abscess of external ear, right  - doxycycline (VIBRAMYCIN) 100 MG capsule; Take 1 capsule (100 mg total) by mouth 2 (two) times daily.  Dispense: 14 capsule; Refill: 0  2. Erectile dysfunction, unspecified erectile dysfunction type  - sildenafil (REVATIO) 20 MG tablet; Take 1-5 tablets PRN  Dispense: 90 tablet; Refill: 2  3. Need for immunization against influenza  - Flu Vaccine QUAD High  Dose(Fluad)

## 2022-07-25 ENCOUNTER — Telehealth: Payer: Self-pay | Admitting: Adult Health

## 2022-07-25 NOTE — Telephone Encounter (Signed)
Left message for patient to call back and schedule Medicare Annual Wellness Visit (AWV) either virtually or in office. Left  my jabber number [phone]   Last AWV 07/26/21 please schedule with Nurse Health Adviser   45 min for awv-i and in office appointments 30 min for awv-s  phone/virtual appointments   

## 2022-08-06 ENCOUNTER — Other Ambulatory Visit: Payer: Self-pay | Admitting: Adult Health

## 2022-08-06 DIAGNOSIS — E782 Mixed hyperlipidemia: Secondary | ICD-10-CM

## 2022-08-09 DIAGNOSIS — D2272 Melanocytic nevi of left lower limb, including hip: Secondary | ICD-10-CM | POA: Diagnosis not present

## 2022-08-09 DIAGNOSIS — L570 Actinic keratosis: Secondary | ICD-10-CM | POA: Diagnosis not present

## 2022-08-09 DIAGNOSIS — X32XXXD Exposure to sunlight, subsequent encounter: Secondary | ICD-10-CM | POA: Diagnosis not present

## 2022-08-21 ENCOUNTER — Telehealth: Payer: Self-pay | Admitting: Adult Health

## 2022-08-21 NOTE — Telephone Encounter (Signed)
Left message for patient to call back and schedule Medicare Annual Wellness Visit (AWV) either virtually or in office. Left  my jabber number [phone]   Last AWV 07/26/21 please schedule with Nurse Health Adviser   45 min for awv-i and in office appointments 30 min for awv-s  phone/virtual appointments   

## 2022-08-24 DIAGNOSIS — H40053 Ocular hypertension, bilateral: Secondary | ICD-10-CM | POA: Diagnosis not present

## 2022-08-27 NOTE — Telephone Encounter (Signed)
Returned patients call  Patient returned my call 08/24/22 

## 2022-09-26 ENCOUNTER — Ambulatory Visit (INDEPENDENT_AMBULATORY_CARE_PROVIDER_SITE_OTHER): Payer: PPO

## 2022-09-26 VITALS — Ht 71.0 in | Wt 226.0 lb

## 2022-09-26 DIAGNOSIS — Z0000 Encounter for general adult medical examination without abnormal findings: Secondary | ICD-10-CM

## 2022-09-26 NOTE — Progress Notes (Signed)
Subjective:   APOLO CUTSHAW is a 66 y.o. male who presents for Medicare Annual/Subsequent preventive examination.  Review of Systems    Virtual Visit via Telephone Note  I connected with  SUDEEP SCHEIBEL on 09/26/22 at 10:30 AM EST by telephone and verified that I am speaking with the correct person using two identifiers.  Location: Patient: Home Provider: Office Persons participating in the virtual visit: patient/Nurse Health Advisor   I discussed the limitations, risks, security and privacy concerns of performing an evaluation and management service by telephone and the availability of in person appointments. The patient expressed understanding and agreed to proceed.  Interactive audio and video telecommunications were attempted between this nurse and patient, however failed, due to patient having technical difficulties OR patient did not have access to video capability.  We continued and completed visit with audio only.  Some vital signs may be absent or patient reported.   Tillie Rung, LPN  Cardiac Risk Factors include: advanced age (>31men, >19 women);hypertension;male gender     Objective:    Today's Vitals   09/26/22 1039  Weight: 226 lb (102.5 kg)  Height: 5\' 11"  (1.803 m)   Body mass index is 31.52 kg/m.     09/26/2022   10:47 AM 07/26/2021   10:41 AM 07/25/2020    9:52 AM 07/23/2019    3:02 PM  Advanced Directives  Does Patient Have a Medical Advance Directive? No No No No  Would patient like information on creating a medical advance directive? No - Patient declined No - Patient declined No - Patient declined Yes (MAU/Ambulatory/Procedural Areas - Information given)    Current Medications (verified) Outpatient Encounter Medications as of 09/26/2022  Medication Sig   amLODipine (NORVASC) 5 MG tablet TAKE ONE TABLET BY MOUTH DAILY   betamethasone dipropionate 0.05 % cream Apply topically.   doxycycline (VIBRAMYCIN) 100 MG capsule Take 1 capsule (100 mg  total) by mouth 2 (two) times daily.   latanoprost (XALATAN) 0.005 % ophthalmic solution Place 1 drop into the left eye nightly.   pravastatin (PRAVACHOL) 20 MG tablet TAKE 1 Tablet BY MOUTH ONCE DAILY at bedtime   sildenafil (REVATIO) 20 MG tablet Take 1-5 tablets PRN   No facility-administered encounter medications on file as of 09/26/2022.    Allergies (verified) Bee venom, Lisinopril, and Nutritional supplements   History: Past Medical History:  Diagnosis Date   Cataract of right eye    ED (erectile dysfunction)    Essential hypertension    Fatty liver    Hyperlipidemia    Myopia    Ocular disease    Pseudophakia of left eye    Retinal detachment    both eyes   Shingles    Sight deterioration    right eye   Past Surgical History:  Procedure Laterality Date   bilateral retinal detachment     x2 each eye   CAPSULOTOMY  2001   left eye   cryopexy  1995   right eye   scleral buckling  1995   left eye   VITRECTOMY  1995   both eyes   Family History  Problem Relation Age of Onset   Stroke Father    Heart attack Father    Dementia Mother    Stomach cancer Mother 30   Retinal detachment Brother    Social History   Socioeconomic History   Marital status: Married    Spouse name: Not on file   Number of children: 2  Years of education: Not on file   Highest education level: Bachelor's degree (e.g., BA, AB, BS)  Occupational History   Not on file  Tobacco Use   Smoking status: Never   Smokeless tobacco: Never  Substance and Sexual Activity   Alcohol use: Yes    Comment: occasionally   Drug use: No   Sexual activity: Not on file  Other Topics Concern   Not on file  Social History 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    One grandchild       Social Determinants of Health   Financial Resource Strain: Low Risk  (09/26/2022)   Overall Financial Resource Strain (CARDIA)    Difficulty of Paying Living Expenses: Not  hard at all  Food Insecurity: No Food Insecurity (09/26/2022)   Hunger Vital Sign    Worried About Running Out of Food in the Last Year: Never true    Ran Out of Food in the Last Year: Never true  Transportation Needs: No Transportation Needs (09/26/2022)   PRAPARE - Administrator, Civil Service (Medical): No    Lack of Transportation (Non-Medical): No  Physical Activity: Inactive (09/26/2022)   Exercise Vital Sign    Days of Exercise per Week: 0 days    Minutes of Exercise per Session: 0 min  Stress: No Stress Concern Present (09/26/2022)   [employer] of Occupational Health - Occupational Stress Questionnaire    Feeling of Stress : Not at all  Social Connections: Socially Integrated (09/26/2022)   Social Connection and Isolation Panel [NHANES]    Frequency of Communication with Friends and Family: More than three times a week    Frequency of Social Gatherings with Friends and Family: More than three times a week    Attends Religious Services: More than 4 times per year    Active Member of [employer] or Organizations: Yes    Attends Engineer, structural: More than 4 times per year    Marital Status: Married    Tobacco Counseling Counseling given: Not Answered   Clinical Intake:  Pre-visit preparation completed: No  Pain : No/denies pain     BMI - recorded: 31.52 Nutritional Status: BMI > 30  Obese Nutritional Risks: None Diabetes: No  How often do you need to have someone help you when you read instructions, pamphlets, or other written materials from your doctor or pharmacy?: 1 - Never  Diabetic?  No  Interpreter Needed?: No  Information entered by :: Theresa Mulligan LPN   Activities of Daily Living    09/26/2022   10:45 AM  In your present state of health, do you have any difficulty performing the following activities:  Hearing? 0  Vision? 0  Difficulty concentrating or making decisions? 0  Walking or climbing stairs? 0  Dressing or  bathing? 0  Doing errands, shopping? 0  Preparing Food and eating ? N  Using the Toilet? N  In the past six months, have you accidently leaked urine? N  Do you have problems with loss of bowel control? N  Managing your Medications? N  Managing your Finances? N  Housekeeping or managing your Housekeeping? N    Patient Care Team: [name], NP as PCP - General (Family Medicine)  Indicate any recent Medical Services you may have received from other than [facility] providers in the past year (date may be approximate).     Assessment:   This is  a routine wellness examination for [place].  Hearing/Vision screen Hearing Screening - Comments:: Denies hearing difficulties   Vision Screening - Comments:: Wears rx glasses - [facility]  Dietary issues and exercise activities discussed: Current Exercise Habits: The patient does not participate in regular exercise at present, Exercise limited by: None identified   Goals Addressed               This Visit's Progress     Increase physical activity (pt-stated)         Depression Screen    09/26/2022   10:44 AM 07/20/2022    7:52 AM 10/26/2021    7:12 AM 07/26/2021   10:42 AM 07/26/2021   10:39 AM 07/25/2020    9:55 AM 07/23/2019    2:34 PM  PHQ 2/9 Scores  PHQ - 2 Score 0 0 0 0 0 0 0  PHQ- 9 Score  2 0   0     Fall Risk    09/26/2022   10:46 AM 10/26/2021    7:12 AM 07/26/2021   10:42 AM 07/25/2020    9:53 AM 07/23/2019    2:52 PM  Fall Risk   Falls in the past year? 0 0 0 0 0  Number falls in past yr: 0 0 0 0   Injury with Fall? 0 0 0 0   Risk for fall due to : No Fall Risks  No Fall Risks No Fall Risks   Follow up Falls prevention discussed  Falls evaluation completed Falls evaluation completed;Falls prevention discussed     FALL RISK PREVENTION PERTAINING TO THE HOME:  Any stairs in or around the home? Yes  If so, are there any without handrails? No  Home free of  loose throw rugs in walkways, pet beds, electrical cords, etc? Yes  Adequate lighting in your home to reduce risk of falls? Yes   ASSISTIVE DEVICES UTILIZED TO PREVENT FALLS:  Life alert? No  Use of a cane, walker or w/c? No  Grab bars in the bathroom? No  Shower chair or bench in shower? No  Elevated toilet seat or a handicapped toilet? No    TIMED UP AND GO:  Was the test performed? No . Audio Visit   Cognitive Function:        09/26/2022   10:47 AM 07/25/2020    9:57 AM  6CIT Screen  What Year? 0 points 0 points  What month? 0 points 0 points  What time? 0 points 0 points  Count back from 20 0 points 0 points  Months in reverse 0 points 0 points  Repeat phrase 0 points 0 points  Total Score 0 points 0 points    Immunizations Immunization History  Administered Date(s) Administered   Fluad Quad(high Dose 65+) 07/28/2020, 07/20/2022   Influenza, Quadrivalent, Recombinant, Inj, Pf 07/26/2018   Influenza,inj,Quad PF,6+ Mos 08/10/2017   Influenza-Unspecified 07/08/2021   PFIZER(Purple Top)SARS-COV-2 Vaccination 10/28/2019, 11/18/2019, 07/08/2020   PPD Test 04/03/2019   Pneumococcal Conjugate-13 07/23/2019   Pneumococcal Polysaccharide-23 07/28/2020   Tdap 04/02/2014   Zoster Recombinat (Shingrix) 06/01/2019, 08/29/2019   Zoster, Live 09/25/2017    TDAP status: Up to date  Flu Vaccine status: Up to date  Pneumococcal vaccine status: Up to date  Covid-19 vaccine status: Completed vaccines  Qualifies for Shingles Vaccine? Yes   Zostavax completed Yes   Shingrix Completed?: Yes  Screening Tests Health Maintenance  Topic Date Due  COVID-19 Vaccine (4 - 2023-24 season) 10/12/2022 (Originally 06/08/2022)   Medicare Annual Wellness (AWV)  09/27/2023   DTaP/Tdap/Td (2 - Td or Tdap) 04/02/2024   COLONOSCOPY (Pts 45-63yrs Insurance coverage will need to be confirmed)  07/09/2027   Pneumonia Vaccine 97+ Years old  Completed   INFLUENZA VACCINE  Completed    Hepatitis C Screening  Completed   Zoster Vaccines- Shingrix  Completed   HPV VACCINES  Aged Out    Health Maintenance  There are no preventive care reminders to display for this patient.   Colorectal cancer screening: Type of screening: Colonoscopy. Completed 07/08/17. Repeat every 10 years  Lung Cancer Screening: (Low Dose CT Chest recommended if Age 78-80 years, 30 pack-year currently smoking OR have quit w/in 15years.) does not qualify.     Additional Screening:  Hepatitis C Screening: does qualify; Completed 09/09/18  Vision Screening: Recommended annual ophthalmology exams for early detection of glaucoma and other disorders of the eye. Is the patient up to date with their annual eye exam?  Yes  Who is the provider or what is the name of the office in which the patient attends annual eye exams? [facility] If pt is not established with a provider, would they like to be referred to a provider to establish care? No .   Dental Screening: Recommended annual dental exams for proper oral hygiene  Community Resource Referral / Chronic Care Management:  CRR required this visit?  No   CCM required this visit?  No      Plan:     I have personally reviewed and noted the following in the patient's chart:   Medical and social history Use of alcohol, tobacco or illicit drugs  Current medications and supplements including opioid prescriptions. Patient is not currently taking opioid prescriptions. Functional ability and status Nutritional status Physical activity Advanced directives List of other physicians Hospitalizations, surgeries, and ER visits in previous 12 months Vitals Screenings to include cognitive, depression, and falls Referrals and appointments  In addition, I have reviewed and discussed with patient certain preventive protocols, quality metrics, and best practice recommendations. A written personalized care plan for preventive services as well as  general preventive health recommendations were provided to patient.     Tillie Rung, LPN   03/50/0938   Nurse Notes: None

## 2022-09-26 NOTE — Patient Instructions (Addendum)
Raymond Mann , Thank you for taking time to come for your Medicare Wellness Visit. I appreciate your ongoing commitment to your health goals. Please review the following plan we discussed and let me know if I can assist you in the future.   These are the goals we discussed:  Goals       DIET - INCREASE LEAN PROTEINS      DIET - REDUCE PORTION SIZE      Exercise 30 minutes 5 times per week      Increase physical activity      Increase physical activity (pt-stated)        This is a list of the screening recommended for you and due dates:  Health Maintenance  Topic Date Due   COVID-19 Vaccine (4 - 2023-24 season) 10/12/2022*   Medicare Annual Wellness Visit  09/27/2023   DTaP/Tdap/Td vaccine (2 - Td or Tdap) 04/02/2024   Colon Cancer Screening  07/09/2027   Pneumonia Vaccine  Completed   Flu Shot  Completed   Hepatitis C Screening: USPSTF Recommendation to screen - Ages 18-79 yo.  Completed   Zoster (Shingles) Vaccine  Completed   HPV Vaccine  Aged Out  *Topic was postponed. The date shown is not the original due date.    Advanced directives: Advance directive discussed with you today. Even though you declined this today, please call our office should you change your mind, and we can give you the proper paperwork for you to fill out.   Conditions/risks identified: None  Next appointment: Follow up in one year for your annual wellness visit.    Preventive Care 46 Years and Older, Male  Preventive care refers to lifestyle choices and visits with your health care provider that can promote health and wellness. What does preventive care include? A yearly physical exam. This is also called an annual well check. Dental exams once or twice a year. Routine eye exams. Ask your health care provider how often you should have your eyes checked. Personal lifestyle choices, including: Daily care of your teeth and gums. Regular physical activity. Eating a healthy diet. Avoiding tobacco and  drug use. Limiting alcohol use. Practicing safe sex. Taking low doses of aspirin every day. Taking vitamin and mineral supplements as recommended by your health care provider. What happens during an annual well check? The services and screenings done by your health care provider during your annual well check will depend on your age, overall health, lifestyle risk factors, and family history of disease. Counseling  Your health care provider may ask you questions about your: Alcohol use. Tobacco use. Drug use. Emotional well-being. Home and relationship well-being. Sexual activity. Eating habits. History of falls. Memory and ability to understand (cognition). Work and work Astronomer. Screening  You may have the following tests or measurements: Height, weight, and BMI. Blood pressure. Lipid and cholesterol levels. These may be checked every 5 years, or more frequently if you are over 7 years old. Skin check. Lung cancer screening. You may have this screening every year starting at age 67 if you have a 30-pack-year history of smoking and currently smoke or have quit within the past 15 years. Fecal occult blood test (FOBT) of the stool. You may have this test every year starting at age 55. Flexible sigmoidoscopy or colonoscopy. You may have a sigmoidoscopy every 5 years or a colonoscopy every 10 years starting at age 24. Prostate cancer screening. Recommendations will vary depending on your family history and other risks. Hepatitis  C blood test. Hepatitis B blood test. Sexually transmitted disease (STD) testing. Diabetes screening. This is done by checking your blood sugar (glucose) after you have not eaten for a while (fasting). You may have this done every 1-3 years. Abdominal aortic aneurysm (AAA) screening. You may need this if you are a current or former smoker. Osteoporosis. You may be screened starting at age 6 if you are at high risk. Talk with your health care provider  about your test results, treatment options, and if necessary, the need for more tests. Vaccines  Your health care provider may recommend certain vaccines, such as: Influenza vaccine. This is recommended every year. Tetanus, diphtheria, and acellular pertussis (Tdap, Td) vaccine. You may need a Td booster every 10 years. Zoster vaccine. You may need this after age 63. Pneumococcal 13-valent conjugate (PCV13) vaccine. One dose is recommended after age 58. Pneumococcal polysaccharide (PPSV23) vaccine. One dose is recommended after age 71. Talk to your health care provider about which screenings and vaccines you need and how often you need them. This information is not intended to replace advice given to you by your health care provider. Make sure you discuss any questions you have with your health care provider. Document Released: 10/21/2015 Document Revised: 06/13/2016 Document Reviewed: 07/26/2015 Elsevier Interactive Patient Education  2017 [employer].  Fall Prevention in the Home Falls can cause injuries. They can happen to people of all ages. There are many things you can do to make your home safe and to help prevent falls. What can I do on the outside of my home? Regularly fix the edges of walkways and driveways and fix any cracks. Remove anything that might make you trip as you walk through a door, such as a raised step or threshold. Trim any bushes or trees on the path to your home. Use bright outdoor lighting. Clear any walking paths of anything that might make someone trip, such as rocks or tools. Regularly check to see if handrails are loose or broken. Make sure that both sides of any steps have handrails. Any raised decks and porches should have guardrails on the edges. Have any leaves, snow, or ice cleared regularly. Use sand or salt on walking paths during winter. Clean up any spills in your garage right away. This includes oil or grease spills. What can I do in the  bathroom? Use night lights. Install grab bars by the toilet and in the tub and shower. Do not use towel bars as grab bars. Use non-skid mats or decals in the tub or shower. If you need to sit down in the shower, use a plastic, non-slip stool. Keep the floor dry. Clean up any water that spills on the floor as soon as it happens. Remove soap buildup in the tub or shower regularly. Attach bath mats securely with double-sided non-slip rug tape. Do not have throw rugs and other things on the floor that can make you trip. What can I do in the bedroom? Use night lights. Make sure that you have a light by your bed that is easy to reach. Do not use any sheets or blankets that are too big for your bed. They should not hang down onto the floor. Have a firm chair that has side arms. You can use this for support while you get dressed. Do not have throw rugs and other things on the floor that can make you trip. What can I do in the kitchen? Clean up any spills right away. Avoid walking  on wet floors. Keep items that you use a lot in easy-to-reach places. If you need to reach something above you, use a strong step stool that has a grab bar. Keep electrical cords out of the way. Do not use floor polish or wax that makes floors slippery. If you must use wax, use non-skid floor wax. Do not have throw rugs and other things on the floor that can make you trip. What can I do with my stairs? Do not leave any items on the stairs. Make sure that there are handrails on both sides of the stairs and use them. Fix handrails that are broken or loose. Make sure that handrails are as long as the stairways. Check any carpeting to make sure that it is firmly attached to the stairs. Fix any carpet that is loose or worn. Avoid having throw rugs at the top or bottom of the stairs. If you do have throw rugs, attach them to the floor with carpet tape. Make sure that you have a light switch at the top of the stairs and the  bottom of the stairs. If you do not have them, ask someone to add them for you. What else can I do to help prevent falls? Wear shoes that: Do not have high heels. Have rubber bottoms. Are comfortable and fit you well. Are closed at the toe. Do not wear sandals. If you use a stepladder: Make sure that it is fully opened. Do not climb a closed stepladder. Make sure that both sides of the stepladder are locked into place. Ask someone to hold it for you, if possible. Clearly mark and make sure that you can see: Any grab bars or handrails. First and last steps. Where the edge of each step is. Use tools that help you move around (mobility aids) if they are needed. These include: Canes. Walkers. Scooters. Crutches. Turn on the lights when you go into a dark area. Replace any light bulbs as soon as they burn out. Set up your furniture so you have a clear path. Avoid moving your furniture around. If any of your floors are uneven, fix them. If there are any pets around you, be aware of where they are. Review your medicines with your doctor. Some medicines can make you feel dizzy. This can increase your chance of falling. Ask your doctor what other things that you can do to help prevent falls. This information is not intended to replace advice given to you by your health care provider. Make sure you discuss any questions you have with your health care provider. Document Released: 07/21/2009 Document Revised: 03/01/2016 Document Reviewed: 10/29/2014 Elsevier Interactive Patient Education  2017 [employer].

## 2022-09-28 ENCOUNTER — Telehealth: Payer: Self-pay | Admitting: Adult Health

## 2022-09-28 NOTE — Telephone Encounter (Signed)
Spoke to pt and advised that Raymond Mann was not in and he may need an appt. Pt also advised that we do not have any available appts. Pt stated he does not remember what his 64 stats were. Pt declined UC and ED visit. Pt advised to watch O2 stats and if he felt worse to go to local UC or ED. Pt started taking OTC medications and he stated that its helping. Will route to PCP for FYI.

## 2022-09-28 NOTE — Telephone Encounter (Signed)
Pt tested positive for COVID with low oxygen concentration and chest px. Triage outcome was for pt to go to ER however pt refused because he just want the meds for COVID.   Please advise.

## 2022-10-02 NOTE — Telephone Encounter (Signed)
Spoke to pt to follow up. Per pt he is taking Tylenol and saline with relief. Pt stated that he is feeling a lot better has not taking 02 stats but stated he is able to take deep breaths. Pt advised to follow up if needed. Pt verbalize understanding. No further actions needed.

## 2022-10-23 ENCOUNTER — Ambulatory Visit (INDEPENDENT_AMBULATORY_CARE_PROVIDER_SITE_OTHER): Payer: PPO | Admitting: Adult Health

## 2022-10-23 ENCOUNTER — Encounter: Payer: Self-pay | Admitting: Adult Health

## 2022-10-23 VITALS — BP 128/80 | HR 95 | Temp 98.3°F | Ht 71.0 in | Wt 233.0 lb

## 2022-10-23 DIAGNOSIS — I8392 Asymptomatic varicose veins of left lower extremity: Secondary | ICD-10-CM | POA: Diagnosis not present

## 2022-10-23 DIAGNOSIS — R051 Acute cough: Secondary | ICD-10-CM

## 2022-10-23 DIAGNOSIS — J988 Other specified respiratory disorders: Secondary | ICD-10-CM

## 2022-10-23 MED ORDER — ALBUTEROL SULFATE HFA 108 (90 BASE) MCG/ACT IN AERS: 2.0000 | INHALATION_SPRAY | Freq: Four times a day (QID) | RESPIRATORY_TRACT | 0 refills | Status: DC | PRN

## 2022-10-23 MED ORDER — PREDNISONE 10 MG PO TABS: ORAL_TABLET | ORAL | 0 refills | Status: DC

## 2022-10-23 MED ORDER — AMOXICILLIN-POT CLAVULANATE 875-125 MG PO TABS: 1.0000 | ORAL_TABLET | Freq: Two times a day (BID) | ORAL | 0 refills | Status: DC

## 2022-10-23 NOTE — Progress Notes (Signed)
Subjective:    Patient ID: SHELDON SEM, male    DOB: 1955-12-01, 67 y.o.   MRN: [id number]  HPI 67 year old male who  has a past medical history of Cataract of right eye, ED (erectile dysfunction), Essential hypertension, Fatty liver, Hyperlipidemia, Myopia, Ocular disease, Pseudophakia of left eye, Retinal detachment, Shingles, and Sight deterioration.  He presents to the office today for an acute issue of productive cough with discolored mucus and sinus pressure. Symptoms have been present for about 3-4 weeks.   He reports using dayquil, nyquil, and delsum as needed but does not have any relief from these medications.  He also has some varicose veins on his left leg. This does cause some discomfort in his left leg and and foot.     Review of Systems See HPI   Past Medical History:  Diagnosis Date   Cataract of right eye    ED (erectile dysfunction)    Essential hypertension    Fatty liver    Hyperlipidemia    Myopia    Ocular disease    Pseudophakia of left eye    Retinal detachment    both eyes   Shingles    Sight deterioration    right eye    Social History   Socioeconomic History   Marital status: Married    Spouse name: Not on file   Number of children: 2   Years of education: Not on file   Highest education level: Bachelor's degree (e.g., BA, AB, BS)  Occupational History   Not on file  Tobacco Use   Smoking status: Never   Smokeless tobacco: Never  Substance and Sexual Activity   Alcohol use: Yes    Comment: occasionally   Drug use: No   Sexual activity: Not on file  Other Topics Concern   Not on file  Social History 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    One grandchild       Social Determinants of Health   Financial Resource Strain: Low Risk  (09/26/2022)   Overall Financial Resource Strain (CARDIA)    Difficulty of Paying Living Expenses: Not hard at all  Food Insecurity: No Food Insecurity (09/26/2022)    Hunger Vital Sign    Worried About Running Out of Food in the Last Year: Never true    Ran Out of Food in the Last Year: Never true  Transportation Needs: No Transportation Needs (09/26/2022)   PRAPARE - Administrator, Civil Service (Medical): No    Lack of Transportation (Non-Medical): No  Physical Activity: Inactive (09/26/2022)   Exercise Vital Sign    Days of Exercise per Week: 0 days    Minutes of Exercise per Session: 0 min  Stress: No Stress Concern Present (09/26/2022)   [employer] of Occupational Health - Occupational Stress Questionnaire    Feeling of Stress : Not at all  Social Connections: Socially Integrated (09/26/2022)   Social Connection and Isolation Panel [NHANES]    Frequency of Communication with Friends and Family: More than three times a week    Frequency of Social Gatherings with Friends and Family: More than three times a week    Attends Religious Services: More than 4 times per year    Active Member of [employer] or Organizations: Yes    Attends Engineer, structural: More than 4 times per year    Marital Status: Married  Intimate  Partner Violence: Not At Risk (09/26/2022)   Humiliation, Afraid, Rape, and Kick questionnaire    Fear of Current or Ex-Partner: No    Emotionally Abused: No    Physically Abused: No    Sexually Abused: No    Past Surgical History:  Procedure Laterality Date   bilateral retinal detachment     x2 each eye   CAPSULOTOMY  2001   left eye   cryopexy  1995   right eye   scleral buckling  1995   left eye   VITRECTOMY  1995   both eyes    Family History  Problem Relation Age of Onset   Stroke Father    Heart attack Father    Dementia Mother    Stomach cancer Mother 20   Retinal detachment Brother     Allergies  Allergen Reactions   Bee Venom Swelling   Lisinopril Cough   Nutritional Supplements Other (See Comments)    Itching, possibly    Current Outpatient Medications on File Prior to  Visit  Medication Sig Dispense Refill   amLODipine (NORVASC) 5 MG tablet TAKE ONE TABLET BY MOUTH DAILY 90 tablet 1   betamethasone dipropionate 0.05 % cream Apply topically.     doxycycline (VIBRAMYCIN) 100 MG capsule Take 1 capsule (100 mg total) by mouth 2 (two) times daily. 14 capsule 0   latanoprost (XALATAN) 0.005 % ophthalmic solution Place 1 drop into the left eye nightly.     pravastatin (PRAVACHOL) 20 MG tablet TAKE 1 Tablet BY MOUTH ONCE DAILY at bedtime 90 tablet PRN   sildenafil (REVATIO) 20 MG tablet Take 1-5 tablets PRN 90 tablet 2   No current facility-administered medications on file prior to visit.    BP 128/80   Pulse 95   Temp 98.3 F (36.8 C) (Oral)   Ht 5\' 11"  (1.803 m)   Wt 233 lb (105.7 kg)   SpO2 96%   BMI 32.50 kg/m       Objective:   Physical Exam Vitals and nursing note reviewed.  Constitutional:      Appearance: Normal appearance.  Cardiovascular:     Rate and Rhythm: Normal rate and regular rhythm.     Pulses: Normal pulses.     Heart sounds: Normal heart sounds.  Pulmonary:     Effort: Pulmonary effort is normal.     Breath sounds: Normal breath sounds.  Skin:    General: Skin is warm and dry.     Comments: Vericose vein in left calf   Neurological:     General: No focal deficit present.     Mental Status: He is alert and oriented to person, place, and time.  Psychiatric:        Mood and Affect: Mood normal.        Behavior: Behavior normal.        Thought Content: Thought content normal.        Judgment: Judgment normal.        Assessment & Plan:  1. Respiratory infection  - amoxicillin-clavulanate (AUGMENTIN) 875-125 MG tablet; Take 1 tablet by mouth 2 (two) times daily.  Dispense: 20 tablet; Refill: 0 - predniSONE (DELTASONE) 10 MG tablet; 40 mg x 3 days, 20 mg x 3 days, 10 mg x 3 days  Dispense: 21 tablet; Refill: 0 - albuterol (VENTOLIN HFA) 108 (90 Base) MCG/ACT inhaler; Inhale 2 puffs into the lungs every 6 (six) hours as  needed for wheezing or shortness of breath.  Dispense: 8  g; Refill: 0  2. Acute cough  - albuterol (VENTOLIN HFA) 108 (90 Base) MCG/ACT inhaler; Inhale 2 puffs into the lungs every 6 (six) hours as needed for wheezing or shortness of breath.  Dispense: 8 g; Refill: 0  3. Varicose veins of left calf  - Ambulatory referral to Vascular Surgery    Dorothyann Peng, NP

## 2022-10-24 ENCOUNTER — Other Ambulatory Visit: Payer: Self-pay | Admitting: Adult Health

## 2022-10-24 DIAGNOSIS — I10 Essential (primary) hypertension: Secondary | ICD-10-CM

## 2022-10-29 ENCOUNTER — Other Ambulatory Visit: Payer: Self-pay | Admitting: *Deleted

## 2022-10-29 DIAGNOSIS — I8390 Asymptomatic varicose veins of unspecified lower extremity: Secondary | ICD-10-CM

## 2022-10-30 ENCOUNTER — Encounter: Payer: PPO | Admitting: Adult Health

## 2022-10-30 DIAGNOSIS — K648 Other hemorrhoids: Secondary | ICD-10-CM | POA: Diagnosis not present

## 2022-10-30 DIAGNOSIS — D124 Benign neoplasm of descending colon: Secondary | ICD-10-CM | POA: Diagnosis not present

## 2022-10-30 DIAGNOSIS — D120 Benign neoplasm of cecum: Secondary | ICD-10-CM | POA: Diagnosis not present

## 2022-10-30 DIAGNOSIS — Z86010 Personal history of colonic polyps: Secondary | ICD-10-CM | POA: Diagnosis not present

## 2022-10-30 DIAGNOSIS — Z09 Encounter for follow-up examination after completed treatment for conditions other than malignant neoplasm: Secondary | ICD-10-CM | POA: Diagnosis not present

## 2022-11-01 DIAGNOSIS — D124 Benign neoplasm of descending colon: Secondary | ICD-10-CM | POA: Diagnosis not present

## 2022-11-01 DIAGNOSIS — D120 Benign neoplasm of cecum: Secondary | ICD-10-CM | POA: Diagnosis not present

## 2022-11-06 NOTE — Progress Notes (Unsigned)
VASC[facility]IN [facility]ISTS           [facility] [place]  History and Physical   Raymond Mann is a 67 y.o. male who presents with LLE varicose veins and swelling.  He states that he has large veins on his left leg.  He does have swelling in the left leg.  He states that his left leg gets itchy and achy.  He doe not have any hx of blood clots.  He does not have family hx of varicose veins.  He has used compression in the past as well as diabetic socks.  He has never had any procedures on his veins. 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    The pt is on a statin for cholesterol management.  The pt is not on a daily aspirin.   Other AC:  none The pt is on CCB for hypertension.   The pt is not diabetic.   Tobacco hx:  never  Pt does not have family hx of AAA.  Past Medical History:  Diagnosis Date   Cataract of right eye    ED (erectile dysfunction)    Essential hypertension    Fatty liver    Hyperlipidemia    Myopia    Ocular disease    Pseudophakia of left eye    Retinal detachment    both eyes   Shingles    Sight deterioration    right eye    Past Surgical History:  Procedure Laterality Date   bilateral retinal detachment     x2 each eye   CAPSULOTOMY  2001   left eye   cryopexy  1995   right eye   scleral buckling  1995   left eye   VITRECTOMY  1995   both eyes    Social History   Socioeconomic History   Marital status: Married    Spouse name: Not on file   Number of children: 2   Years of education: Not on file   Highest education level: Bachelor's degree (e.g., BA, AB, BS)  Occupational History   Not on file  Tobacco Use   Smoking status: Never   Smokeless tobacco: Never  Substance and Sexual Activity   Alcohol use: Yes    Comment: occasionally   Drug use: No   Sexual activity: Not on file  Other Topics Concern   Not on file  Social History Narrative   Sales  Rep for for [employer]    Married    Two children    One grandchild       Social Determinants of Health   Financial Resource Strain: Low Risk  (09/26/2022)   Overall Financial Resource Strain (CARDIA)    Difficulty of Paying Living Expenses: Not hard at all  Food Insecurity: No Food Insecurity (09/26/2022)   Hunger Vital Sign    Worried About Running Out of Food in the Last Year: Never true    Ran Out of Food in the Last Year: Never true  Transportation Needs: No Transportation Needs (09/26/2022)   PRAPARE - Hydrologist (Medical): No    Lack of Transportation (Non-Medical): No  Physical Activity: Inactive (09/26/2022)   Exercise Vital Sign    Days of Exercise per Week: 0 days    Minutes of Exercise per Session: 0 min  Stress:  No Stress Concern Present (09/26/2022)   [place]    Feeling of Stress : Not at all  Social Connections: [place] (09/26/2022)   Social Connection and Isolation Panel [NHANES]    Frequency of Communication with Friends and Family: More than three times a week    Frequency of Social Gatherings with Friends and Family: More than three times a week    Attends Religious Services: More than 4 times per year    Active Member of [employer] or Organizations: Yes    Attends Music therapist: More than 4 times per year    Marital Status: Married  Human resources officer Violence: Not At Risk (09/26/2022)   Humiliation, Afraid, Rape, and Kick questionnaire    Fear of Current or Ex-Partner: No    Emotionally Abused: No    Physically Abused: No    Sexually Abused: No     Family History  Problem Relation Age of Onset   Stroke Father    Heart attack Father    Dementia Mother    Stomach cancer Mother 86   Retinal detachment Brother     Current Outpatient Medications  Medication Sig Dispense Refill   albuterol (VENTOLIN HFA) 108 (90 Base) MCG/ACT  inhaler Inhale 2 puffs into the lungs every 6 (six) hours as needed for wheezing or shortness of breath. 8 g 0   amLODipine (NORVASC) 5 MG tablet TAKE ONE TABLET BY MOUTH DAILY 90 tablet 1   amoxicillin-clavulanate (AUGMENTIN) 875-125 MG tablet Take 1 tablet by mouth 2 (two) times daily. 20 tablet 0   betamethasone dipropionate 0.05 % cream Apply topically.     latanoprost (XALATAN) 0.005 % ophthalmic solution Place 1 drop into the left eye nightly.     pravastatin (PRAVACHOL) 20 MG tablet TAKE 1 Tablet BY MOUTH ONCE DAILY at bedtime 90 tablet PRN   predniSONE (DELTASONE) 10 MG tablet 40 mg x 3 days, 20 mg x 3 days, 10 mg x 3 days 21 tablet 0   sildenafil (REVATIO) 20 MG tablet Take 1-5 tablets PRN 90 tablet 2   No current facility-administered medications for this visit.    Allergies  Allergen Reactions   Bee Venom Swelling   Lisinopril Cough   Nutritional Supplements Other (See Comments)    Itching, possibly    REVIEW OF SYSTEMS:   [X]  denotes positive finding, [ ]  denotes negative finding Cardiac  Comments:  Chest pain or chest pressure:    Shortness of breath upon exertion:    Short of breath when lying flat:    Irregular heart rhythm:        Vascular    Pain in calf, thigh, or hip brought on by ambulation:    Pain in feet at night that wakes you up from your sleep:     Blood clot in your veins:    Leg swelling:  x       Pulmonary    Oxygen at home:    Productive cough:     Wheezing:         Neurologic    Sudden weakness in arms or legs:     Sudden numbness in arms or legs:     Sudden onset of difficulty speaking or slurred speech:    Temporary loss of vision in one eye:     Problems with dizziness:         Gastrointestinal    Blood in stool:  Vomited blood:         Genitourinary    Burning when urinating:     Blood in urine:        Psychiatric    Major depression:         Hematologic    Bleeding problems:    Problems with blood clotting too easily:         Skin    Rashes or ulcers:        Constitutional    Fever or chills:      PHYSICAL EXAMINATION:  Today's Vitals   11/07/22 1251  BP: (!) 133/97  Pulse: 88  Resp: 18  Temp: 97.7 F (36.5 C)  TempSrc: Oral  SpO2: 97%  Weight: 233 lb (105.7 kg)  Height: 5\' 11"  (1.803 m)   Body mass index is 32.5 kg/m.   General:  WDWN in NAD; vital signs documented above Gait: Not observed HENT: WNL, normocephalic Pulmonary: normal non-labored breathing without wheezing Cardiac: regular HR; without carotid bruits Abdomen: soft, NT, aortic pulse is not palpable Skin: without rashes Vascular Exam/Pulses:  Right Left  Radial 2+ (normal) 2+ (normal)  DP 2+ (normal) 2+ (normal)   Extremities: varicosities present left leg      Neurologic: A&O X 3;  moving all extremities equally Psychiatric:  The pt has Normal affect.   Non-Invasive Vascular Imaging:   Venous duplex on 11/07/2022: +--------------+---------+------+-----------+------------+--------+  LEFT         Reflux NoRefluxReflux TimeDiameter cmsComments                          Yes                                   +--------------+---------+------+-----------+------------+--------+  CFV                    yes   >1 second                       +--------------+---------+------+-----------+------------+--------+  FV mid        no                                              +--------------+---------+------+-----------+------------+--------+  Popliteal              yes   >1 second                       +--------------+---------+------+-----------+------------+--------+  GSV at SFJ              yes    >500 ms      0.53              +--------------+---------+------+-----------+------------+--------+  GSV prox thigh          yes    >500 ms      0.45              +--------------+---------+------+-----------+------------+--------+  GSV mid thigh no                             0.45              +--------------+---------+------+-----------+------------+--------+  GSV dist thigh  yes    >500 ms      0.27              +--------------+---------+------+-----------+------------+--------+  GSV at knee   no                            0.36              +--------------+---------+------+-----------+------------+--------+  GSV prox calf           yes    >500 ms      0.29              +--------------+---------+------+-----------+------------+--------+  SSV Pop Fossa           yes    >500 ms      0.66              +--------------+---------+------+-----------+------------+--------+  SSV prox calf           yes    >500 ms      0.62              +--------------+---------+------+-----------+------------+--------+  SSV mid calf            yes    >500 ms      0.52              +--------------+---------+------+-----------+------------+--------+   Summary:  Left:  - No evidence of deep vein thrombosis seen in the left lower extremity, from the common femoral through the popliteal veins.  - No evidence of superficial venous thrombosis in the left lower extremity.  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     CORTNEY MCKINNEY is a 67 y.o. male who presents with: swelling, achiness itching of the LLE with varicose veins    -pt has easily palpable DP pedal pulses bilaterally -pt does not have evidence of DVT.  Pt does have venous reflux in the deep as well as the superficial venous system in the GSV at the [facility] and in the thigh and SSV with the SSV measuring 0.5cm to 0.66cm and 0.45cm in the GSV. -discussed with pt about wearing thigh high 20-30 mmHg compression stockings and pt was measured for these today.    -discussed the importance of leg elevation and how to elevate properly - pt is advised to elevate their legs and a diagram is given to them  to demonstrate for pt to lay flat on their back with knees elevated and slightly bent with their feet higher than their knees, which puts their feet higher than their heart for 15 minutes per day.  If pt cannot lay flat, advised to lay as flat as possible.  -pt is advised to continue as much walking as possible and avoid sitting or standing for long periods of time.  -discussed importance of weight loss and exercise and that water aerobics would also be beneficial.  -handout with recommendations given -pt will f/u 3 months for further evaluation for laser ablation.    Doreatha Massed, [facility] [phone]  Clinic MD:  Randie Heinz

## 2022-11-07 ENCOUNTER — Ambulatory Visit (HOSPITAL_COMMUNITY)
Admission: RE | Admit: 2022-11-07 | Discharge: 2022-11-07 | Disposition: A | Discharge status: Home / Self Care | Payer: PPO | Source: Ambulatory Visit | Attending: Vascular Surgery | Admitting: Vascular Surgery

## 2022-11-07 ENCOUNTER — Ambulatory Visit (INDEPENDENT_AMBULATORY_CARE_PROVIDER_SITE_OTHER): Payer: PPO | Admitting: Physician Assistant

## 2022-11-07 VITALS — BP 133/97 | HR 88 | Temp 97.7°F | Resp 18 | Ht 71.0 in | Wt 233.0 lb

## 2022-11-07 DIAGNOSIS — M7989 Other specified soft tissue disorders: Secondary | ICD-10-CM

## 2022-11-07 DIAGNOSIS — I8392 Asymptomatic varicose veins of left lower extremity: Secondary | ICD-10-CM | POA: Diagnosis not present

## 2022-11-07 DIAGNOSIS — I8393 Asymptomatic varicose veins of bilateral lower extremities: Secondary | ICD-10-CM

## 2022-11-07 DIAGNOSIS — I8390 Asymptomatic varicose veins of unspecified lower extremity: Secondary | ICD-10-CM

## 2022-11-08 ENCOUNTER — Encounter: Payer: Self-pay | Admitting: Adult Health

## 2022-11-08 ENCOUNTER — Ambulatory Visit (INDEPENDENT_AMBULATORY_CARE_PROVIDER_SITE_OTHER): Payer: PPO | Admitting: Adult Health

## 2022-11-08 VITALS — BP 110/90 | HR 80 | Temp 98.1°F | Ht 71.0 in | Wt 229.0 lb

## 2022-11-08 DIAGNOSIS — Z0000 Encounter for general adult medical examination without abnormal findings: Secondary | ICD-10-CM

## 2022-11-08 DIAGNOSIS — R351 Nocturia: Secondary | ICD-10-CM

## 2022-11-08 DIAGNOSIS — T63441S Toxic effect of venom of bees, accidental (unintentional), sequela: Secondary | ICD-10-CM | POA: Diagnosis not present

## 2022-11-08 DIAGNOSIS — R5383 Other fatigue: Secondary | ICD-10-CM

## 2022-11-08 DIAGNOSIS — I10 Essential (primary) hypertension: Secondary | ICD-10-CM | POA: Diagnosis not present

## 2022-11-08 DIAGNOSIS — E782 Mixed hyperlipidemia: Secondary | ICD-10-CM

## 2022-11-08 DIAGNOSIS — G479 Sleep disorder, unspecified: Secondary | ICD-10-CM

## 2022-11-08 DIAGNOSIS — K2100 Gastro-esophageal reflux disease with esophagitis, without bleeding: Secondary | ICD-10-CM

## 2022-11-08 LAB — LIPID PANEL
Cholesterol: 170 mg/dL (ref 0–200)
HDL: 43.1 mg/dL (ref >39.00)
LDL Cholesterol: 90 mg/dL (ref 0–99)
NonHDL: 127.06
Total CHOL/HDL Ratio: 4
Triglycerides: 186 mg/dL — ABNORMAL HIGH (ref 0.0–149.0)
VLDL: 37.2 mg/dL (ref 0.0–40.0)

## 2022-11-08 LAB — CBC
HCT: 46.5 % (ref 39.0–52.0)
Hemoglobin: 16.3 g/dL (ref 13.0–17.0)
MCHC: 35.1 g/dL (ref 30.0–36.0)
MCV: 91 fl (ref 78.0–100.0)
Platelets: 229 10*3/uL (ref 150.0–400.0)
RBC: 5.11 Mil/uL (ref 4.22–5.81)
RDW: 13.4 % (ref 11.5–15.5)
WBC: 5.9 10*3/uL (ref 4.0–10.5)

## 2022-11-08 LAB — COMPREHENSIVE METABOLIC PANEL
ALT: 21 U/L (ref 0–53)
AST: 14 U/L (ref 0–37)
Albumin: 4.4 g/dL (ref 3.5–5.2)
Alkaline Phosphatase: 67 U/L (ref 39–117)
BUN: 17 mg/dL (ref 6–23)
CO2: 24 mEq/L (ref 19–32)
Calcium: 8.9 mg/dL (ref 8.4–10.5)
Chloride: 105 mEq/L (ref 96–112)
Creatinine, Ser: 1.03 mg/dL (ref 0.40–1.50)
GFR: 74.73 mL/min (ref >60.00)
Glucose, Bld: 105 mg/dL — ABNORMAL HIGH (ref 70–99)
Potassium: 4.4 mEq/L (ref 3.5–5.1)
Sodium: 140 mEq/L (ref 135–145)
Total Bilirubin: 0.8 mg/dL (ref 0.2–1.2)
Total Protein: 6.8 g/dL (ref 6.0–8.3)

## 2022-11-08 LAB — VITAMIN B12: Vitamin B-12: 215 pg/mL (ref 211–911)

## 2022-11-08 LAB — HEMOGLOBIN A1C: Hgb A1c MFr Bld: 5.7 % (ref 4.6–6.5)

## 2022-11-08 LAB — PSA: PSA: 1.28 ng/mL (ref 0.10–4.00)

## 2022-11-08 LAB — TSH: TSH: 3.19 u[IU]/mL (ref 0.35–5.50)

## 2022-11-08 MED ORDER — OMEPRAZOLE 40 MG PO CPDR: 40.0000 mg | DELAYED_RELEASE_CAPSULE | Freq: Every day | ORAL | 3 refills | Status: DC

## 2022-11-08 MED ORDER — EPINEPHRINE 0.3 MG/0.3ML IJ SOAJ: INTRAMUSCULAR | 1 refills | Status: AC

## 2022-11-08 NOTE — Progress Notes (Signed)
Subjective:    Patient ID: Raymond Mann, male    DOB: Aug 04, 1956, 67 y.o.   MRN: [id number]  HPI  Patient presents for yearly preventative medicine examination. He is a pleasant 67 year old male who  has a past medical history of Cataract of right eye, ED (erectile dysfunction), Essential hypertension, Fatty liver, Hyperlipidemia, Myopia, Ocular disease, Pseudophakia of left eye, Retinal detachment, Shingles, and Sight deterioration.  Hypertension-takes Norvasc 5 mg daily.  He denies dizziness, lightheadedness, chest pain, or shortness of breath BP Readings from Last 3 Encounters:  11/08/22 (!) 110/90  11/07/22 (!) 133/97  10/23/22 128/80   Hyperlipidemia-is controlled with pravastatin 20 mg daily.  He denies myalgia or fatigue Lab Results  Component Value Date   CHOL 143 10/26/2021   HDL 38.20 (L) 10/26/2021   LDLCALC 86 10/26/2021   TRIG 94.0 10/26/2021   CHOLHDL 4 10/26/2021   Erectile dysfunction-takes sildenafil PRN   Nocturia - awakes about 3 times a night to urinate. Denies decreased stream or incomplete bladder emptying. Does drink a lot of water throughout the day   Concern for sleep apnea - His wife reports that he has very loud snoring and apneic episodes with gasping for breath. He feels fatigued and could take a nap at anytime.   Fatigue - due to fatigue his son would like him to have a testosterone level checked and B12. Denies any other symptoms of hypogonadism or b12 deficiency   GERD - has symptoms mostly at night a few times a week. He has been taking OTC medication as needed   Bee Sting Anaphylactic reaction - he would like to have an epi pen on hand     All immunizations and health maintenance protocols were reviewed with the patient and needed orders were placed.  Appropriate screening laboratory values were ordered for the patient including screening of hyperlipidemia, renal function and hepatic function. If indicated by BPH, a PSA was  ordered.  Medication reconciliation,  past medical history, social history, problem list and allergies were reviewed in detail with the patient  Goals were established with regard to weight loss, exercise, and  diet in compliance with medications  He had his colonoscopy last week  Review of Systems  Constitutional:  Positive for fatigue.  HENT: Negative.    Eyes: Negative.   Respiratory: Negative.    Cardiovascular: Negative.   Gastrointestinal: Negative.   Endocrine: Negative.   Genitourinary: Negative.   Musculoskeletal: Negative.   Skin: Negative.   Allergic/Immunologic: Negative.   Neurological: Negative.   Hematological: Negative.   Psychiatric/Behavioral:  Positive for sleep disturbance.   All other systems reviewed and are negative.  Past Medical History:  Diagnosis Date   Cataract of right eye    ED (erectile dysfunction)    Essential hypertension    Fatty liver    Hyperlipidemia    Myopia    Ocular disease    Pseudophakia of left eye    Retinal detachment    both eyes   Shingles    Sight deterioration    right eye    Social History   Socioeconomic History   Marital status: Married    Spouse name: Not on file   Number of children: 2   Years of education: Not on file   Highest education level: Bachelor's degree (e.g., BA, AB, BS)  Occupational History   Not on file  Tobacco Use   Smoking status: Never   Smokeless tobacco: Never  Substance and  Sexual Activity   Alcohol use: Yes    Comment: occasionally   Drug use: No   Sexual activity: Not on file  Other Topics Concern   Not on file  Social History 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    One grandchild       Social Determinants of Health   Financial Resource Strain: Low Risk  (09/26/2022)   Overall Financial Resource Strain (CARDIA)    Difficulty of Paying Living Expenses: Not hard at all  Food Insecurity: No Food Insecurity (09/26/2022)   Hunger Vital  Sign    Worried About Running Out of Food in the Last Year: Never true    Ran Out of Food in the Last Year: Never true  Transportation Needs: No Transportation Needs (09/26/2022)   PRAPARE - Hydrologist (Medical): No    Lack of Transportation (Non-Medical): No  Physical Activity: Inactive (09/26/2022)   Exercise Vital Sign    Days of Exercise per Week: 0 days    Minutes of Exercise per Session: 0 min  Stress: No Stress Concern Present (09/26/2022)   [place]    Feeling of Stress : Not at all  Social Connections: [place] (09/26/2022)   Social Connection and Isolation Panel [NHANES]    Frequency of Communication with Friends and Family: More than three times a week    Frequency of Social Gatherings with Friends and Family: More than three times a week    Attends Religious Services: More than 4 times per year    Active Member of [employer] or Organizations: Yes    Attends Music therapist: More than 4 times per year    Marital Status: Married  Human resources officer Violence: Not At Risk (09/26/2022)   Humiliation, Afraid, Rape, and Kick questionnaire    Fear of Current or Ex-Partner: No    Emotionally Abused: No    Physically Abused: No    Sexually Abused: No    Past Surgical History:  Procedure Laterality Date   bilateral retinal detachment     x2 each eye   CAPSULOTOMY  2001   left eye   cryopexy  1995   right eye   scleral buckling  1995   left eye   VITRECTOMY  1995   both eyes    Family History  Problem Relation Age of Onset   Stroke Father    Heart attack Father    Dementia Mother    Stomach cancer Mother 63   Retinal detachment Brother     Allergies  Allergen Reactions   Bee Venom Swelling   Lisinopril Cough   Nutritional Supplements Other (See Comments)    Itching, possibly    Current Outpatient Medications on File Prior to Visit   Medication Sig Dispense Refill   albuterol (VENTOLIN HFA) 108 (90 Base) MCG/ACT inhaler Inhale 2 puffs into the lungs every 6 (six) hours as needed for wheezing or shortness of breath. 8 g 0   amLODipine (NORVASC) 5 MG tablet TAKE ONE TABLET BY MOUTH DAILY 90 tablet 1   betamethasone dipropionate 0.05 % cream Apply topically.     EPINEPHrine (EPIPEN 2-PAK) 0.3 mg/0.3 mL IJ SOAJ injection as directed Injection     esomeprazole (NEXIUM) 20 MG capsule 1 capsule Orally once a day if needed     latanoprost (XALATAN) 0.005 % ophthalmic solution Place 1  drop into the left eye nightly.     lisinopril (ZESTRIL) 20 MG tablet as needed.     pravastatin (PRAVACHOL) 20 MG tablet TAKE 1 Tablet BY MOUTH ONCE DAILY at bedtime 90 tablet PRN   saccharomyces boulardii (FLORASTOR) 250 MG capsule 1 capsule Orally Twice a day     sildenafil (REVATIO) 20 MG tablet Take 1-5 tablets PRN 90 tablet 2   No current facility-administered medications on file prior to visit.    BP (!) 110/90   Pulse 80   Temp 98.1 F (36.7 C) (Oral)   Ht 5\' 11"  (1.803 m)   Wt 229 lb (103.9 kg)   SpO2 98%   BMI 31.94 kg/m       Objective:   Physical Exam Vitals and nursing note reviewed.  Constitutional:      General: He is not in acute distress.    Appearance: Normal appearance. He is obese. He is not ill-appearing.  HENT:     Head: Normocephalic and atraumatic.     Right Ear: Tympanic membrane, ear canal and external ear normal. There is no impacted cerumen.     Left Ear: Tympanic membrane, ear canal and external ear normal. There is no impacted cerumen.     Nose: Nose normal. No congestion or rhinorrhea.     Mouth/Throat:     Mouth: Mucous membranes are moist.     Pharynx: Oropharynx is clear.  Eyes:     Extraocular Movements: Extraocular movements intact.     Conjunctiva/sclera: Conjunctivae normal.     Pupils: Pupils are equal, round, and reactive to light.  Neck:     Vascular: No carotid bruit.  Cardiovascular:      Rate and Rhythm: Normal rate and regular rhythm.     Pulses: Normal pulses.     Heart sounds: No murmur heard.    No friction rub. No gallop.  Pulmonary:     Effort: Pulmonary effort is normal.     Breath sounds: Normal breath sounds.  Abdominal:     General: Abdomen is flat. Bowel sounds are normal. There is no distension.     Palpations: Abdomen is soft. There is no mass.     Tenderness: There is no abdominal tenderness. There is no guarding or rebound.     Hernia: No hernia is present.  Musculoskeletal:        General: Swelling present. Normal range of motion.     Cervical back: Normal range of motion and neck supple.     Right lower leg: No edema.     Left lower leg: No edema.  Lymphadenopathy:     Cervical: No cervical adenopathy.  Skin:    General: Skin is warm and dry.     Capillary Refill: Capillary refill takes less than 2 seconds.     Coloration: Skin is not jaundiced.  Neurological:     General: No focal deficit present.     Mental Status: He is alert and oriented to person, place, and time.  Psychiatric:        Mood and Affect: Mood normal.        Behavior: Behavior normal.        Thought Content: Thought content normal.        Judgment: Judgment normal.       Assessment & Plan:   1. Routine general medical examination at a health care facility Today patient counseled on age appropriate routine health concerns for screening and prevention, each reviewed and up  to date or declined. Immunizations reviewed and up to date or declined. Labs ordered and reviewed. Risk factors for depression reviewed and negative. Hearing function and visual acuity are intact. ADLs screened and addressed as needed. Functional ability and level of safety reviewed and appropriate. Education, counseling and referrals performed based on assessed risks today. Patient provided with a copy of personalized plan for preventive services. - Follow up in one year or sooner if needed - Encouraged  weight loss through diet and exercise   2. Essential hypertension - Controlled. No change in medication  - Lipid panel; Future - TSH; Future - CBC; Future - Comprehensive metabolic panel; Future - Hemoglobin A1c; Future - Hemoglobin A1c - Comprehensive metabolic panel - CBC - TSH - Lipid panel  3. Mixed hyperlipidemia - Consider increase in statin  - Lipid panel; Future - TSH; Future - CBC; Future - Comprehensive metabolic panel; Future - Hemoglobin A1c; Future - Hemoglobin A1c - Comprehensive metabolic panel - CBC - TSH - Lipid panel  4. Nocturia  - PSA; Future - PSA  5. Sleep disturbance - likely has sleep apnea - STOP BANG score 7  - Encouraged weight loss  - Vitamin B12; Future - Testosterone Total,Free,Bio, Males; Future - Ambulatory referral to Pulmonology - Testosterone Total,Free,Bio, Males - Vitamin B12  6. Gastroesophageal reflux disease with esophagitis without hemorrhage - take daily x 4 weeks and then stop  - omeprazole (PRILOSEC) 40 MG capsule; Take 1 capsule (40 mg total) by mouth daily.  Dispense: 30 capsule; Refill: 3  7. Anaphylactic reaction to bee sting, accidental or unintentional, sequela  - EPINEPHrine (EPIPEN 2-PAK) 0.3 mg/0.3 mL IJ SOAJ injection; as directed Injection  Dispense: 1 each; Refill: 1  8. Other fatigue - likely from sleep apnea  - Lipid panel; Future - TSH; Future - CBC; Future - Comprehensive metabolic panel; Future - Vitamin B12; Future - Testosterone Total,Free,Bio, Males; Future  [name], NP

## 2022-11-08 NOTE — Patient Instructions (Addendum)
It was great seeing you today   We will follow up with you regarding your lab work   Please let me know if you need anything   Pulmonary will call you to set up an appointment

## 2022-11-09 LAB — TESTOSTERONE TOTAL,FREE,BIO, MALES
Albumin: 4.4 g/dL (ref 3.6–5.1)
Sex Hormone Binding: 37 nmol/L (ref 22–77)
Testosterone, Bioavailable: 93.9 ng/dL — ABNORMAL LOW (ref 110.0–575.0)
Testosterone, Free: 46.6 pg/mL (ref 46.0–224.0)
Testosterone: 391 ng/dL (ref 250–827)

## 2022-11-23 ENCOUNTER — Ambulatory Visit (INDEPENDENT_AMBULATORY_CARE_PROVIDER_SITE_OTHER): Payer: PPO

## 2022-11-23 ENCOUNTER — Encounter: Payer: Self-pay | Admitting: Adult Health

## 2022-11-23 ENCOUNTER — Ambulatory Visit: Payer: PPO | Admitting: Adult Health

## 2022-11-23 VITALS — BP 118/70 | HR 91 | Temp 98.3°F | Ht 71.0 in | Wt 233.6 lb

## 2022-11-23 DIAGNOSIS — R053 Chronic cough: Secondary | ICD-10-CM

## 2022-11-23 DIAGNOSIS — R0683 Snoring: Secondary | ICD-10-CM

## 2022-11-23 MED ORDER — BENZONATATE 200 MG PO CAPS: 200.0000 mg | ORAL_CAPSULE | Freq: Three times a day (TID) | ORAL | 1 refills | Status: DC | PRN

## 2022-11-23 NOTE — Patient Instructions (Addendum)
Set up for home sleep study Healthy sleep regimen Do not drive if sleepy Work on healthy weight  Chest xray today  Begin Allegra 171m daily in am  Begin Chlorpheniramine 459m(Chlor tabs) 1 At bedtime   Continue on Prilosec 2070maily  Begin Pepcid 48m56m bedtime   Begin Delsym 2 tsp Twice daily  for cough , As needed   Tessalon Three times a day  for cough As needed   Saline nasal spray Twice daily   Albuterol inhaler As needed   Follow-up in 6 to 8 weeks with PFTs  and to discuss sleep study results and treatment plan

## 2022-11-23 NOTE — Assessment & Plan Note (Signed)
Chronic cough questionable etiology.  Possibly mild intermittent asthma versus upper airway cough syndrome.  Will treat for triggers of postnasal drainage and reflux.  Check PFTs on return visit.  Check chest x-ray today.  Add cough control regimen.  May use albuterol as needed  Plan  Patient Instructions  Set up for home sleep study Healthy sleep regimen Do not drive if sleepy Work on healthy weight  Chest xray today  Begin Allegra 196m daily in am  Begin Chlorpheniramine 421m(Chlor tabs) 1 At bedtime   Continue on Prilosec 2055maily  Begin Pepcid 39m58m bedtime   Begin Delsym 2 tsp Twice daily  for cough , As needed   Tessalon Three times a day  for cough As needed   Saline nasal spray Twice daily   Albuterol inhaler As needed   Follow-up in 6 to 8 weeks with PFTs  and to discuss sleep study results and treatment plan

## 2022-11-23 NOTE — Progress Notes (Signed)
$@PatientY$  ID: Raymond Mann, male    DOB: 04-13-1956, 67 y.o.   MRN: [id number]  Chief Complaint  Patient presents with   Consult    Referring provider: Dorothyann Peng, NP  HPI: 67 year old male seen for consult November 23, 2022 for snoring, restless sleep, daytime sleepiness, choking and gasping for air during his sleep and chronic cough   TEST/EVENTS :   11/23/2022 Consult  Patient presents for sleep consult.  Kindly referred by primary care provider Dorothyann Peng, NP .  Patient complains of snoring, feeling tired all the time.  Going on for long time, worse for last 5 years. Gasping for air during his sleep, witnessed apneic events.  His sleep pattern and snoring is disruptive to his spouse.  Snoring is very loud. Patient says he feels tired all the time no matter how much he sleeps.  Typically goes to bed between 9 to 11 PM.  Can take up to 30 minutes to go to sleep.  Up multiple times at the night.  Gets about 8 to 9:30 AM.  He has never had a previous sleep study.  Has minimal caffeine intake.  No symptoms suspicious for cataplexy or sleep paralysis.  No history of stroke or congestive heart failure.  Epworth score is 13 out of 24.  Typically gets sleepy if he sits down to rest or watch TV and in the evening hours. No removeable dental work.  Patient naps most days unintentionally.  Weight is steady over the last 2 years.  Current weight is at 233 pounds with a BMI of 32.  No sleep aides.   Intermittent cough and congestion for years. Recently treated bronchitis with antibiotics and steroids . Some better. Never smoker . No hemoptysis . Cough Is worse at night .  No exercise. Has 1 cat. No hottub or basement . No unusual hobbies. Worked in Audiological scientist.  No history of asthma. Started on Albuterol inhaler.  Took some mucinex with some help.       Past Medical History:  Diagnosis Date   Cataract of right eye    ED (erectile dysfunction)    Essential hypertension    Fatty liver     Hyperlipidemia    Myopia    Ocular disease    Pseudophakia of left eye    Retinal detachment    both eyes   Shingles    Sight deterioration    right eye     Surgical history none  Social history patient is married.  Is retired. From sales. Has adult children.. Is a never smoker.  No alcohol or drug use.  No exercise   Family History  Problem Relation Age of Onset   Stroke Father    Heart attack Father    Dementia Mother    Stomach cancer Mother 28   Retinal detachment Brother     Allergies  Allergen Reactions   Bee Venom Swelling   Lisinopril Cough   Nutritional Supplements Other (See Comments)    Itching, possibly    Immunization History  Administered Date(s) Administered   Fluad Quad(high Dose 65+) 07/28/2020, 07/20/2022   Influenza, Quadrivalent, Recombinant, Inj, Pf 07/26/2018   Influenza,inj,Quad PF,6+ Mos 08/10/2017   Influenza-Unspecified 07/08/2021   PFIZER(Purple Top)SARS-COV-2 Vaccination 10/28/2019, 11/18/2019, 07/08/2020   PPD Test 04/03/2019   Pneumococcal Conjugate-13 07/23/2019   Pneumococcal Polysaccharide-23 07/28/2020   Tdap 04/02/2014   Zoster Recombinat (Shingrix) 06/01/2019, 08/29/2019   Zoster, Live 09/25/2017   Past Surgical History:  Procedure Laterality Date  bilateral retinal detachment     x2 each eye   CAPSULOTOMY  2001   left eye   cryopexy  1995   right eye   scleral buckling  1995   left eye   VITRECTOMY  1995   both eyes  Umbilical hernia surgery 2023.    Tobacco History: Social History   Tobacco Use  Smoking Status Never  Smokeless Tobacco Never   Counseling given: Not Answered   Outpatient Medications Prior to Visit  Medication Sig Dispense Refill   albuterol (VENTOLIN HFA) 108 (90 Base) MCG/ACT inhaler Inhale 2 puffs into the lungs every 6 (six) hours as needed for wheezing or shortness of breath. 8 g 0   amLODipine (NORVASC) 5 MG tablet TAKE ONE TABLET BY MOUTH DAILY 90 tablet 1   betamethasone  dipropionate 0.05 % cream Apply topically.     EPINEPHrine (EPIPEN 2-PAK) 0.3 mg/0.3 mL IJ SOAJ injection as directed Injection 1 each 1   latanoprost (XALATAN) 0.005 % ophthalmic solution Place 1 drop into the left eye nightly.     omeprazole (PRILOSEC) 40 MG capsule Take 1 capsule (40 mg total) by mouth daily. 30 capsule 3   pravastatin (PRAVACHOL) 20 MG tablet TAKE 1 Tablet BY MOUTH ONCE DAILY at bedtime 90 tablet PRN   sildenafil (REVATIO) 20 MG tablet Take 1-5 tablets PRN 90 tablet 2   saccharomyces boulardii (FLORASTOR) 250 MG capsule 1 capsule Orally Twice a day     No facility-administered medications prior to visit.     Review of Systems:   Constitutional:   No  weight loss, night sweats,  Fevers, chills, + fatigue, or  lassitude.  HEENT:   No headaches,  Difficulty swallowing,  Tooth/dental problems, or  Sore throat,                No sneezing, itching, ear ache,  +nasal congestion, post nasal drip,   CV:  No chest pain,  Orthopnea, PND, swelling in lower extremities, anasarca, dizziness, palpitations, syncope.   GI  No  abdominal pain, nausea, vomiting, diarrhea, change in bowel habits, loss of appetite, bloody stools.   Resp: .  No chest wall deformity  Skin: no rash or lesions.  GU: no dysuria, change in color of urine, no urgency or frequency.  No flank pain, no hematuria   MS:  No joint pain or swelling.  No decreased range of motion.  No back pain.    Physical Exam  BP 118/70 (BP Location: Left Arm, Patient Position: Sitting, Cuff Size: Normal)   Pulse 91   Temp 98.3 F (36.8 C) (Oral)   Ht 5' 11"$  (1.803 m)   Wt 233 lb 9.6 oz (106 kg)   SpO2 96% Comment: RA  BMI 32.58 kg/m   GEN: A/Ox3; pleasant , NAD, well nourished    HEENT:  [place]/AT,  EACs-clear, TMs-wnl, NOSE-clear, THROAT-clear, no lesions, no postnasal drip or exudate noted. Class 3 MP airway , elongated uvula  NECK:  Supple w/ fair ROM; no JVD; normal carotid impulses w/o bruits; no thyromegaly  or nodules palpated; no lymphadenopathy.    RESP  Clear  P & A; w/o, wheezes/ rales/ or rhonchi. no accessory muscle use, no dullness to percussion  CARD:  RRR, no m/r/g, no peripheral edema, pulses intact, no cyanosis or clubbing.  GI:   Soft & nt; nml bowel sounds; no organomegaly or masses detected.   Musco: Warm bil, no deformities or joint swelling noted.   Neuro: alert, no  focal deficits noted.    Skin: Warm, no lesions or rashes    Lab Results:  CBC    Component Value Date/Time   WBC 5.9 11/08/2022 0749   RBC 5.11 11/08/2022 0749   HGB 16.3 11/08/2022 0749   HCT 46.5 11/08/2022 0749   PLT 229.0 11/08/2022 0749   MCV 91.0 11/08/2022 0749   MCH 31.1 09/26/2020 0841   MCHC 35.1 11/08/2022 0749   RDW 13.4 11/08/2022 0749   LYMPHSABS 1.4 10/26/2021 0730   MONOABS 0.4 10/26/2021 0730   EOSABS 0.1 10/26/2021 0730   BASOSABS 0.0 10/26/2021 0730    BMET    Component Value Date/Time   NA 140 11/08/2022 0749   K 4.4 11/08/2022 0749   CL 105 11/08/2022 0749   CO2 24 11/08/2022 0749   GLUCOSE 105 (H) 11/08/2022 0749   BUN 17 11/08/2022 0749   CREATININE 1.03 11/08/2022 0749   CREATININE 1.05 09/26/2020 0841   CALCIUM 8.9 11/08/2022 0749   GFRNONAA >60 09/01/2018 1107   GFRAA >60 09/01/2018 1107    BNP    Component Value Date/Time   BNP 20.5 09/01/2018 1107    ProBNP No results found for: "PROBNP"         No data to display          No results found for: "NITRICOXIDE"      Assessment & Plan:   Snoring Snoring, restless sleep, daytime sleepiness, gasping for air during sleep all suspicious for underlying sleep apnea Will set up for home sleep study - discussed how weight can impact sleep and risk for sleep disordered breathing - discussed options to assist with weight loss: combination of diet modification, cardiovascular and strength training exercises   - had an extensive discussion regarding the adverse health consequences related to  untreated sleep disordered breathing - specifically discussed the risks for hypertension, coronary artery disease, cardiac dysrhythmias, cerebrovascular disease, and diabetes - lifestyle modification discussed   - discussed how sleep disruption can increase risk of accidents, particularly when driving - safe driving practices were discussed   Plan  Patient Instructions  Set up for home sleep study Healthy sleep regimen Do not drive if sleepy Work on healthy weight  Chest xray today  Begin Allegra 159m daily in am  Begin Chlorpheniramine 445m(Chlor tabs) 1 At bedtime   Continue on Prilosec 2076maily  Begin Pepcid 41m85m bedtime   Begin Delsym 2 tsp Twice daily  for cough , As needed   Tessalon Three times a day  for cough As needed   Saline nasal spray Twice daily   Albuterol inhaler As needed   Follow-up in 6 to 8 weeks with PFTs  and to discuss sleep study results and treatment plan    Chronic cough Chronic cough questionable etiology.  Possibly mild intermittent asthma versus upper airway cough syndrome.  Will treat for triggers of postnasal drainage and reflux.  Check PFTs on return visit.  Check chest x-ray today.  Add cough control regimen.  May use albuterol as needed  Plan  Patient Instructions  Set up for home sleep study Healthy sleep regimen Do not drive if sleepy Work on healthy weight  Chest xray today  Begin Allegra 180mg38mly in am  Begin Chlorpheniramine 4mg (98mor tabs) 1 At bedtime   Continue on Prilosec 41mg d44m  Begin Pepcid 41mg At49mtime   Begin Delsym 2 tsp Twice daily  for cough , As needed   Tessalon Three times a  day  for cough As needed   Saline nasal spray Twice daily   Albuterol inhaler As needed   Follow-up in 6 to 8 weeks with PFTs  and to discuss sleep study results and treatment plan      Rexene Edison, NP 11/23/2022

## 2022-11-23 NOTE — Assessment & Plan Note (Signed)
Snoring, restless sleep, daytime sleepiness, gasping for air during sleep all suspicious for underlying sleep apnea Will set up for home sleep study - discussed how weight can impact sleep and risk for sleep disordered breathing - discussed options to assist with weight loss: combination of diet modification, cardiovascular and strength training exercises   - had an extensive discussion regarding the adverse health consequences related to untreated sleep disordered breathing - specifically discussed the risks for hypertension, coronary artery disease, cardiac dysrhythmias, cerebrovascular disease, and diabetes - lifestyle modification discussed   - discussed how sleep disruption can increase risk of accidents, particularly when driving - safe driving practices were discussed   Plan  Patient Instructions  Set up for home sleep study Healthy sleep regimen Do not drive if sleepy Work on healthy weight  Chest xray today  Begin Allegra 180m daily in am  Begin Chlorpheniramine 490m(Chlor tabs) 1 At bedtime   Continue on Prilosec 206maily  Begin Pepcid 36m54m bedtime   Begin Delsym 2 tsp Twice daily  for cough , As needed   Tessalon Three times a day  for cough As needed   Saline nasal spray Twice daily   Albuterol inhaler As needed   Follow-up in 6 to 8 weeks with PFTs  and to discuss sleep study results and treatment plan

## 2022-11-23 NOTE — Progress Notes (Signed)
Reviewed and agree with assessment/plan.   Chesley Mires, MD [facility] 11/23/2022, 10:37 AM Pager:  [phone]

## 2022-11-29 ENCOUNTER — Telehealth: Payer: Self-pay | Admitting: Adult Health

## 2022-11-29 NOTE — Telephone Encounter (Signed)
PT calling saying Horris Latino called to review hs results. No tel encounter noted. Pls call [phone] to advise PT. Thanks.

## 2022-11-30 NOTE — Telephone Encounter (Signed)
Went over chest xray results with patient. He verbalized understanding. Nothing further needed.

## 2022-12-04 ENCOUNTER — Encounter: Payer: Self-pay | Admitting: Adult Health

## 2022-12-04 ENCOUNTER — Telehealth (INDEPENDENT_AMBULATORY_CARE_PROVIDER_SITE_OTHER): Payer: PPO | Admitting: Adult Health

## 2022-12-04 VITALS — Ht 71.0 in | Wt 233.0 lb

## 2022-12-04 DIAGNOSIS — K529 Noninfective gastroenteritis and colitis, unspecified: Secondary | ICD-10-CM | POA: Diagnosis not present

## 2022-12-04 MED ORDER — ONDANSETRON 4 MG PO TBDP: 4.0000 mg | ORAL_TABLET | Freq: Three times a day (TID) | ORAL | 1 refills | Status: DC | PRN

## 2022-12-04 NOTE — Progress Notes (Signed)
Virtual Visit via Video Note  I connected with Raymond Mann  on 12/04/22 at  5:15 PM EST by a video enabled telemedicine application and verified that I am speaking with the correct person using two identifiers.  Location patient: home Location provider:work or home office Persons participating in the virtual visit: patient, provider  I discussed the limitations of evaluation and management by telemedicine and the availability of in person appointments. The patient expressed understanding and agreed to proceed.   HPI: 67 year old male who is being evaluated today for an acute issue.  He reports that earlier today he started to have vomiting and diarrhea on a pretty consistent basis.  He has had multiple instances of diarrhea and vomiting throughout the day.  He denies any sick contacts that he knows of.  Denies fevers or chills.  He is not able to keep anything on his stomach.   ROS: See pertinent positives and negatives per HPI.  Past Medical History:  Diagnosis Date   Cataract of right eye    ED (erectile dysfunction)    Essential hypertension    Fatty liver    Hyperlipidemia    Myopia    Ocular disease    Pseudophakia of left eye    Retinal detachment    both eyes   Shingles    Sight deterioration    right eye    Past Surgical History:  Procedure Laterality Date   bilateral retinal detachment     x2 each eye   CAPSULOTOMY  2001   left eye   cryopexy  1995   right eye   scleral buckling  1995   left eye   VITRECTOMY  1995   both eyes    Family History  Problem Relation Age of Onset   Stroke Father    Heart attack Father    Dementia Mother    Stomach cancer Mother 44   Retinal detachment Brother        Current Outpatient Medications:    albuterol (VENTOLIN HFA) 108 (90 Base) MCG/ACT inhaler, Inhale 2 puffs into the lungs every 6 (six) hours as needed for wheezing or shortness of breath., Disp: 8 g, Rfl: 0   amLODipine (NORVASC) 5 MG tablet, TAKE ONE TABLET  BY MOUTH DAILY, Disp: 90 tablet, Rfl: 1   benzonatate (TESSALON) 200 MG capsule, Take 1 capsule (200 mg total) by mouth 3 (three) times daily as needed., Disp: 45 capsule, Rfl: 1   betamethasone dipropionate 0.05 % cream, Apply topically., Disp: , Rfl:    brimonidine (ALPHAGAN) 0.2 % ophthalmic solution, Apply to eye., Disp: , Rfl:    carboxymethylcellulose (REFRESH TEARS) 0.5 % SOLN, Apply to eye., Disp: , Rfl:    EPINEPHrine (EPIPEN 2-PAK) 0.3 mg/0.3 mL IJ SOAJ injection, as directed Injection, Disp: 1 each, Rfl: 1   latanoprost (XALATAN) 0.005 % ophthalmic solution, Place 1 drop into the left eye nightly., Disp: , Rfl:    lisinopril (ZESTRIL) 20 MG tablet, 20 mg every morning., Disp: , Rfl:    omeprazole (PRILOSEC) 40 MG capsule, Take 1 capsule (40 mg total) by mouth daily., Disp: 30 capsule, Rfl: 3   ondansetron (ZOFRAN-ODT) 4 MG disintegrating tablet, Take 1 tablet (4 mg total) by mouth every 8 (eight) hours as needed for nausea or vomiting., Disp: 20 tablet, Rfl: 1   pravastatin (PRAVACHOL) 20 MG tablet, TAKE 1 Tablet BY MOUTH ONCE DAILY at bedtime, Disp: 90 tablet, Rfl: PRN   sildenafil (REVATIO) 20 MG tablet, Take 1-5 tablets  PRN, Disp: 90 tablet, Rfl: 2  EXAM:  VITALS per patient if applicable:  GENERAL: alert, oriented, appears well and in no acute distress  HEENT: atraumatic, conjunttiva clear, no obvious abnormalities on inspection of external nose and ears  NECK: normal movements of the head and neck  LUNGS: on inspection no signs of respiratory distress, breathing rate appears normal, no obvious gross SOB, gasping or wheezing  CV: no obvious cyanosis  MS: moves all visible extremities without noticeable abnormality  PSYCH/NEURO: pleasant and cooperative, no obvious depression or anxiety, speech and thought processing grossly intact  ASSESSMENT AND PLAN:  Discussed the following assessment and plan:  1. Gastroenteritis -Likely norovirus.  Will send in Zofran.  Advised  bland diet, can try Gatorade and or popsicles to help stay hydrated.  He should start feeling better within 24 hours.  He knows to follow-up if no improvement - ondansetron (ZOFRAN-ODT) 4 MG disintegrating tablet; Take 1 tablet (4 mg total) by mouth every 8 (eight) hours as needed for nausea or vomiting.  Dispense: 20 tablet; Refill: 1      I discussed the assessment and treatment plan with the patient. The patient was provided an opportunity to ask questions and all were answered. The patient agreed with the plan and demonstrated an understanding of the instructions.   The patient was advised to call back or seek an in-person evaluation if the symptoms worsen or if the condition fails to improve as anticipated.   Dorothyann Peng, NP

## 2022-12-04 NOTE — Telephone Encounter (Signed)
Please advise 

## 2022-12-05 ENCOUNTER — Telehealth: Payer: Self-pay | Admitting: Adult Health

## 2022-12-05 NOTE — Telephone Encounter (Signed)
error 

## 2022-12-06 ENCOUNTER — Ambulatory Visit: Payer: PPO | Admitting: Adult Health

## 2022-12-06 DIAGNOSIS — G4733 Obstructive sleep apnea (adult) (pediatric): Secondary | ICD-10-CM

## 2022-12-06 DIAGNOSIS — R053 Chronic cough: Secondary | ICD-10-CM

## 2022-12-14 DIAGNOSIS — G4733 Obstructive sleep apnea (adult) (pediatric): Secondary | ICD-10-CM

## 2022-12-18 ENCOUNTER — Telehealth: Payer: Self-pay | Admitting: Adult Health

## 2022-12-18 NOTE — Telephone Encounter (Signed)
Please set up office visit to discuss sleep study results and treatment plan HST showed severe OSA with AHI 68/ hr & low sat 73% SUggest auto CPAP but also schedule formal ttiration study

## 2022-12-19 NOTE — Telephone Encounter (Signed)
ATC patient x1.  LVM to return call.  He does have a f/u with TP on 01/29/23 after a PFT.  Will await return call.

## 2022-12-19 NOTE — Progress Notes (Signed)
ATC patient x1.  LVM to return call.  He does have a f/u with TP on 01/29/23 after a PFT.  Will await return call.

## 2022-12-19 NOTE — Progress Notes (Signed)
Patient returned call, I spoke with patient, provided results/recommendations per Rexene Edison NP.  He verbalized understanding.  Scheduled to see Tammy on Monday 3/18 at 2 pm, advised to arrive by 1:45 pm for check in.  Nothing further needed.

## 2022-12-24 ENCOUNTER — Ambulatory Visit: Payer: PPO | Admitting: Adult Health

## 2022-12-24 ENCOUNTER — Encounter: Payer: Self-pay | Admitting: Adult Health

## 2022-12-24 VITALS — BP 120/74 | HR 97 | Temp 97.6°F | Ht 70.0 in | Wt 237.8 lb

## 2022-12-24 DIAGNOSIS — R053 Chronic cough: Secondary | ICD-10-CM | POA: Diagnosis not present

## 2022-12-24 DIAGNOSIS — G4733 Obstructive sleep apnea (adult) (pediatric): Secondary | ICD-10-CM | POA: Insufficient documentation

## 2022-12-24 NOTE — Progress Notes (Signed)
@Patient  ID: Raymond Mann, male    DOB: 07-24-1956, 67 y.o.   MRN: [id number]  Chief Complaint  Patient presents with   Follow-up    Referring provider: Dorothyann Peng, NP  HPI: 67 year old male never smoker seen for consult on November 23, 2022 for snoring, daytime sleepiness and cough found to have severe obstructive sleep apnea and upper airway cough syndrome.  TEST/EVENTS :   12/24/2022 Follow up : OSA and Cough  Patient presents for a 1 month follow-up.  Patient was seen last visit for a consult.  He complained of snoring, daytime sleepiness and restless sleep.  He was set up for a home sleep study that was completed on December 06, 2022 that showed severe sleep apnea with AHI at 68/hour and SpO2 low at 73%.  We discussed his sleep study results in detail.  Went over treatment options.  Patient will proceed with CPAP therapy.  Last visit patient had had a chronic cough for years.  He has been treated over the years with antibiotics and steroids.  Cough is worse at night.  He was recommended to begin Allegra daily along with Chlortab 4 mg at bedtime.  Also Prilosec and Pepcid.  Started on Delsym and Tessalon for cough control.  Patient says since last visit his cough is improving but is it has not totally went away but is definitely better than it was.  He has PFTs set up for next month.  Chest x-ray last visit was clear.  Allergies  Allergen Reactions   Bee Venom Swelling   Lisinopril Cough   Nutritional Supplements Other (See Comments)    Itching, possibly    Immunization History  Administered Date(s) Administered   Fluad Quad(high Dose 65+) 07/28/2020, 07/20/2022   Influenza, Quadrivalent, Recombinant, Inj, Pf 07/26/2018   Influenza,inj,Quad PF,6+ Mos 08/10/2017   Influenza-Unspecified 07/08/2021   PFIZER(Purple Top)SARS-COV-2 Vaccination 10/28/2019, 11/18/2019, 07/08/2020   PPD Test 04/03/2019   Pneumococcal Conjugate-13 07/23/2019   Pneumococcal Polysaccharide-23  07/28/2020   Tdap 04/02/2014   Zoster Recombinat (Shingrix) 06/01/2019, 08/29/2019   Zoster, Live 09/25/2017    Past Medical History:  Diagnosis Date   Cataract of right eye    ED (erectile dysfunction)    Essential hypertension    Fatty liver    Hyperlipidemia    Myopia    Ocular disease    Pseudophakia of left eye    Retinal detachment    both eyes   Shingles    Sight deterioration    right eye    Tobacco History: Social History   Tobacco Use  Smoking Status Never  Smokeless Tobacco Never   Counseling given: Not Answered   Outpatient Medications Prior to Visit  Medication Sig Dispense Refill   albuterol (VENTOLIN HFA) 108 (90 Base) MCG/ACT inhaler Inhale 2 puffs into the lungs every 6 (six) hours as needed for wheezing or shortness of breath. 8 g 0   amLODipine (NORVASC) 5 MG tablet TAKE ONE TABLET BY MOUTH DAILY 90 tablet 1   benzonatate (TESSALON) 200 MG capsule Take 1 capsule (200 mg total) by mouth 3 (three) times daily as needed. 45 capsule 1   betamethasone dipropionate 0.05 % cream Apply topically.     brimonidine (ALPHAGAN) 0.2 % ophthalmic solution Apply to eye.     carboxymethylcellulose (REFRESH TEARS) 0.5 % SOLN Apply to eye.     EPINEPHrine (EPIPEN 2-PAK) 0.3 mg/0.3 mL IJ SOAJ injection as directed Injection 1 each 1   latanoprost (XALATAN) 0.005 %  ophthalmic solution Place 1 drop into the left eye nightly.     lisinopril (ZESTRIL) 20 MG tablet 20 mg every morning.     omeprazole (PRILOSEC) 40 MG capsule Take 1 capsule (40 mg total) by mouth daily. 30 capsule 3   ondansetron (ZOFRAN-ODT) 4 MG disintegrating tablet Take 1 tablet (4 mg total) by mouth every 8 (eight) hours as needed for nausea or vomiting. 20 tablet 1   pravastatin (PRAVACHOL) 20 MG tablet TAKE 1 Tablet BY MOUTH ONCE DAILY at bedtime 90 tablet PRN   sildenafil (REVATIO) 20 MG tablet Take 1-5 tablets PRN 90 tablet 2   No facility-administered medications prior to visit.     Review of  Systems:   Constitutional:   No  weight loss, night sweats,  Fevers, chills, fatigue, or  lassitude.  HEENT:   No headaches,  Difficulty swallowing,  Tooth/dental problems, or  Sore throat,                No sneezing, itching, ear ache, + nasal congestion, post nasal drip,   CV:  No chest pain,  Orthopnea, PND, swelling in lower extremities, anasarca, dizziness, palpitations, syncope.   GI  No heartburn, indigestion, abdominal pain, nausea, vomiting, diarrhea, change in bowel habits, loss of appetite, bloody stools.   Resp: .  No chest wall deformity  Skin: no rash or lesions.  GU: no dysuria, change in color of urine, no urgency or frequency.  No flank pain, no hematuria   MS:  No joint pain or swelling.  No decreased range of motion.  No back pain.    Physical Exam  BP 120/74 (BP Location: Left Arm, Patient Position: Sitting, Cuff Size: Large)   Pulse 97   Temp 97.6 F (36.4 C) (Oral)   Ht 5\' 10"  (1.778 m)   Wt 237 lb 12.8 oz (107.9 kg)   SpO2 97%   BMI 34.12 kg/m   GEN: A/Ox3; pleasant , NAD, well nourished    HEENT:  [place]/AT,   NOSE-clear, THROAT-clear, no lesions, no postnasal drip or exudate noted.   NECK:  Supple w/ fair ROM; no JVD; normal carotid impulses w/o bruits; no thyromegaly or nodules palpated; no lymphadenopathy.    RESP  Clear  P & A; w/o, wheezes/ rales/ or rhonchi. no accessory muscle use, no dullness to percussion  CARD:  RRR, no m/r/g, no peripheral edema, pulses intact, no cyanosis or clubbing.  GI:   Soft & nt; nml bowel sounds; no organomegaly or masses detected.   Musco: Warm bil, no deformities or joint swelling noted.   Neuro: alert, no focal deficits noted.    Skin: Warm, no lesions or rashes    Lab Results:  CBC  BNP   ProBNP No results found for: "PROBNP"  Imaging: No results found.        No data to display          No results found for: "NITRICOXIDE"      Assessment & Plan:   OSA (obstructive sleep  apnea) Home sleep study shows severe sleep apnea.  Will begin CPAP therapy at bedtime-begin AutoSet CPAP 5 to 15 cm H2O.  Mask of choice.  Patient would like to try a smaller nasal mask if possible.  We discussed the DreamWear nasal mask. - discussed how weight can impact sleep and risk for sleep disordered breathing - discussed options to assist with weight loss: combination of diet modification, cardiovascular and strength training exercises   - had an  extensive discussion regarding the adverse health consequences related to untreated sleep disordered breathing - specifically discussed the risks for hypertension, coronary artery disease, cardiac dysrhythmias, cerebrovascular disease, and diabetes - lifestyle modification discussed   - discussed how sleep disruption can increase risk of accidents, particularly when driving - safe driving practices were discussed     Plan  Patient Instructions  Begin CPAP At bedtime, wear all night long for at least 6hr or more.  Healthy sleep regimen Do not drive if sleepy Work on healthy weight May try the dream wear nasal mask .   Allegra 180mg  daily in am  Chlorpheniramine 4mg  (Chlor tabs) 1 At bedtime   Continue on Prilosec 20mg  daily  Pepcid 20mg  At bedtime   Delsym 2 tsp Twice daily  for cough , As needed   Tessalon Three times a day  for cough As needed   Saline nasal spray Twice daily   Albuterol inhaler As needed   Follow-up next month as planned.     Chronic cough Chronic cough-possible upper airway cough syndrome versus mild intermittent asthma.  Some improvement with treatment aimed towards prevention of postnasal drainage and reflux.  PFTs are pending.  Chest x-ray last visit was clear.  Continue on current regimen  Plan  Patient Instructions  Begin CPAP At bedtime, wear all night long for at least 6hr or more.  Healthy sleep regimen Do not drive if sleepy Work on healthy weight May try the dream wear nasal mask .   Allegra  180mg  daily in am  Chlorpheniramine 4mg  (Chlor tabs) 1 At bedtime   Continue on Prilosec 20mg  daily  Pepcid 20mg  At bedtime   Delsym 2 tsp Twice daily  for cough , As needed   Tessalon Three times a day  for cough As needed   Saline nasal spray Twice daily   Albuterol inhaler As needed   Follow-up next month as planned.       Rexene Edison, NP 12/24/2022

## 2022-12-24 NOTE — Patient Instructions (Addendum)
Begin CPAP At bedtime, wear all night long for at least 6hr or more.  Healthy sleep regimen Do not drive if sleepy Work on healthy weight May try the dream wear nasal mask .   Allegra 180mg  daily in am  Chlorpheniramine 4mg  (Chlor tabs) 1 At bedtime   Continue on Prilosec 20mg  daily  Pepcid 20mg  At bedtime   Delsym 2 tsp Twice daily  for cough , As needed   Tessalon Three times a day  for cough As needed   Saline nasal spray Twice daily   Albuterol inhaler As needed   Follow-up next month as planned.

## 2022-12-24 NOTE — Assessment & Plan Note (Signed)
Chronic cough-possible upper airway cough syndrome versus mild intermittent asthma.  Some improvement with treatment aimed towards prevention of postnasal drainage and reflux.  PFTs are pending.  Chest x-ray last visit was clear.  Continue on current regimen  Plan  Patient Instructions  Begin CPAP At bedtime, wear all night long for at least 6hr or more.  Healthy sleep regimen Do not drive if sleepy Work on healthy weight May try the dream wear nasal mask .   Allegra 180mg  daily in am  Chlorpheniramine 4mg  (Chlor tabs) 1 At bedtime   Continue on Prilosec 20mg  daily  Pepcid 20mg  At bedtime   Delsym 2 tsp Twice daily  for cough , As needed   Tessalon Three times a day  for cough As needed   Saline nasal spray Twice daily   Albuterol inhaler As needed   Follow-up next month as planned.

## 2022-12-24 NOTE — Assessment & Plan Note (Signed)
Home sleep study shows severe sleep apnea.  Will begin CPAP therapy at bedtime-begin AutoSet CPAP 5 to 15 cm H2O.  Mask of choice.  Patient would like to try a smaller nasal mask if possible.  We discussed the DreamWear nasal mask. - discussed how weight can impact sleep and risk for sleep disordered breathing - discussed options to assist with weight loss: combination of diet modification, cardiovascular and strength training exercises   - had an extensive discussion regarding the adverse health consequences related to untreated sleep disordered breathing - specifically discussed the risks for hypertension, coronary artery disease, cardiac dysrhythmias, cerebrovascular disease, and diabetes - lifestyle modification discussed   - discussed how sleep disruption can increase risk of accidents, particularly when driving - safe driving practices were discussed     Plan  Patient Instructions  Begin CPAP At bedtime, wear all night long for at least 6hr or more.  Healthy sleep regimen Do not drive if sleepy Work on healthy weight May try the dream wear nasal mask .   Allegra 180mg  daily in am  Chlorpheniramine 4mg  (Chlor tabs) 1 At bedtime   Continue on Prilosec 20mg  daily  Pepcid 20mg  At bedtime   Delsym 2 tsp Twice daily  for cough , As needed   Tessalon Three times a day  for cough As needed   Saline nasal spray Twice daily   Albuterol inhaler As needed   Follow-up next month as planned.

## 2022-12-25 NOTE — Progress Notes (Signed)
Reviewed and agree with assessment/plan.   Chesley Mires, MD [facility] Pulmonary/Critical Care 12/25/2022, 8:09 AM Pager:  [phone]

## 2023-01-15 DIAGNOSIS — G4733 Obstructive sleep apnea (adult) (pediatric): Secondary | ICD-10-CM | POA: Diagnosis not present

## 2023-01-24 DIAGNOSIS — G4733 Obstructive sleep apnea (adult) (pediatric): Secondary | ICD-10-CM | POA: Diagnosis not present

## 2023-01-29 ENCOUNTER — Ambulatory Visit: Payer: PPO | Admitting: Pulmonary Disease

## 2023-01-29 ENCOUNTER — Encounter: Payer: Self-pay | Admitting: Adult Health

## 2023-01-29 ENCOUNTER — Other Ambulatory Visit: Payer: Self-pay | Admitting: Adult Health

## 2023-01-29 ENCOUNTER — Ambulatory Visit (INDEPENDENT_AMBULATORY_CARE_PROVIDER_SITE_OTHER): Payer: PPO | Admitting: Adult Health

## 2023-01-29 VITALS — BP 120/84 | HR 79 | Temp 97.5°F | Ht 72.5 in | Wt 239.0 lb

## 2023-01-29 DIAGNOSIS — G4733 Obstructive sleep apnea (adult) (pediatric): Secondary | ICD-10-CM

## 2023-01-29 DIAGNOSIS — R053 Chronic cough: Secondary | ICD-10-CM

## 2023-01-29 DIAGNOSIS — K2100 Gastro-esophageal reflux disease with esophagitis, without bleeding: Secondary | ICD-10-CM

## 2023-01-29 DIAGNOSIS — I10 Essential (primary) hypertension: Secondary | ICD-10-CM

## 2023-01-29 LAB — PULMONARY FUNCTION TEST
DL/VA % pred: 115 %
DL/VA: 4.68 ml/min/mmHg/L
DLCO cor % pred: 65 %
DLCO cor: 18.64 ml/min/mmHg
DLCO unc % pred: 65 %
DLCO unc: 18.64 ml/min/mmHg
FEF 25-75 Pre: 2.06 L/sec
FEF2575-%Pred-Pre: 73 %
FEV1-%Pred-Pre: 64 %
FEV1-Pre: 2.37 L
FEV1FVC-%Pred-Pre: 70 %
FEV6-%Pred-Pre: 87 %
FEV6-Pre: 4.07 L
FEV6FVC-%Pred-Pre: 105 %
FVC-%Pred-Pre: 92 %
FVC-Pre: 4.54 L
Pre FEV1/FVC ratio: 52 %
Pre FEV6/FVC Ratio: 100 %
RV % pred: 35 %
RV: 0.9 L
TLC % pred: 102 %
TLC: 7.68 L

## 2023-01-29 MED ORDER — BUDESONIDE-FORMOTEROL FUMARATE 80-4.5 MCG/ACT IN AERO: 2.0000 | INHALATION_SPRAY | Freq: Two times a day (BID) | RESPIRATORY_TRACT | 12 refills | Status: DC

## 2023-01-29 NOTE — Patient Instructions (Addendum)
Continue on CPAP At bedtime, wear all night long for at least 6hr or more.  Healthy sleep regimen Do not drive if sleepy Work on healthy weight Saline nasal spray Twice daily   Saline nasal gel At bedtime     Begin Symbicort 80 2 puffs Twice daily, rinse after use.  Allegra  daily in am  Chlorpheniramine  (Chlor tabs) 1 At bedtime As needed  drainage .   Continue on Prilosec  daily  Pepcid  At bedtime   Delsym 2 tsp Twice daily  for cough , As needed   Tessalon Three times a day  for cough As needed   Saline nasal spray Twice daily   Albuterol inhaler As needed   Follow up with in 4 weeks with Spirometry and As needed   Please contact office for sooner follow up if symptoms do not improve or worsen or seek emergency care

## 2023-01-29 NOTE — Progress Notes (Signed)
Full Cleda Daub performed today except Post Cleda Daub due to not meeting standards for [employer].

## 2023-01-29 NOTE — Patient Instructions (Signed)
Full PFT performed today except Post Raymond Mann due to not meeting standards for Pre spiro.

## 2023-01-29 NOTE — Assessment & Plan Note (Signed)
Severe obstructive sleep apnea-patient has excellent control compliance on nocturnal CPAP.  No changes.  Plan  Patient Instructions  Continue on CPAP At bedtime, wear all night long for at least 6hr or more.  Healthy sleep regimen Do not drive if sleepy Work on healthy weight Saline nasal spray Twice daily   Saline nasal gel At bedtime     Begin Symbicort 80 2 puffs Twice daily, rinse after use.  Allegra  daily in am  Chlorpheniramine  (Chlor tabs) 1 At bedtime As needed  drainage .   Continue on Prilosec  daily  Pepcid  At bedtime   Delsym 2 tsp Twice daily  for cough , As needed   Tessalon Three times a day  for cough As needed   Saline nasal spray Twice daily   Albuterol inhaler As needed   Follow up with in 4 weeks with Spirometry and As needed   Please contact office for sooner follow up if symptoms do not improve or worsen or seek emergency care

## 2023-01-29 NOTE — Progress Notes (Signed)
  ID: Raymond Mann, male    DOB: May 14, 1956, 67 y.o.   MRN: [id number]  Chief Complaint  Patient presents with   Follow-up    Referring provider: [name], NP  HPI: 67 year old male never smoker seen for consult November 23, 2022 for snoring found to have very severe sleep apnea and a chronic cough.  TEST/EVENTS :  home sleep study December 06, 2022 that showed severe sleep apnea with AHI at 68/hour and SpO2 low at 73%.    01/29/23 PFTs moderate airflow obstruction with an FEV1 at 64%, ratio at 52, FVC 92%, DLCO 65%.  01/29/2023 Follow up ; OSA and Chronic cough  Patient presents for 1 month follow-up.  Patient was seen in February for a sleep consult for snoring.  He was set up for a home sleep study December 06, 2022 that showed severe sleep apnea with AHI at 68/hour and SpO2 low at 73%.  Patient was started on CPAP therapy.  Patient says he has started CPAP and feels like it is starting to help him.  He has less daytime sleepiness.  He is using a DreamWear nasal mask.  Feels like his energy level has picked up some as well.  CPAP download shows excellent compliance.  Daily average usage at 9 hours.  Patient is on auto CPAP 5 to 15 cm H2O.  Daily average pressure at 9.3 cm H2O.  AHI 3.8/hour.  Patient last visit was complaining of a chronic cough for years.  He has been treated frequently for recurrent bronchitis with antibiotics and steroids.  Cough seems to be worse at night.  Last visit he was recommended to begin Allegra daily.  Chlor tabs at bedtime.  Continued on Prilosec and Pepcid.  And use Delsym and Tessalon for cough control.  He was set up for pulmonary function testing which was completed today.  This showed moderate airflow obstruction with an FEV1 at 64%, ratio at 52, FVC 92%, DLCO 65%.  Patient is a never smoker.  There was no postbronchodilator portion of the test.  Patient does have occasional wheezing. Patient does have some chronic allergy symptoms.   Intermittent nasal congestion and postnasal drainage.  Since last visit patient says his cough has improved it has not totally went away but definitely better than it was.      Allergies  Allergen Reactions   Bee Venom Swelling   Lisinopril Cough   Nutritional Supplements Other (See Comments)    Itching, possibly    Immunization History  Administered Date(s) Administered   Fluad Quad(high Dose 65+) 07/28/2020, 07/20/2022   Influenza, Quadrivalent, Recombinant, Inj, Pf 07/26/2018   Influenza,inj,Quad PF,6+ Mos 08/10/2017   Influenza-Unspecified 07/08/2021   PFIZER(Purple Top)SARS-COV-2 Vaccination 10/28/2019, 11/18/2019, 07/08/2020   PPD Test 04/03/2019   Pneumococcal Conjugate-13 07/23/2019   Pneumococcal Polysaccharide-23 07/28/2020   Tdap 04/02/2014   Zoster Recombinat (Shingrix) 06/01/2019, 08/29/2019   Zoster, Live 09/25/2017    Past Medical History:  Diagnosis Date   Cataract of right eye    ED (erectile dysfunction)    Essential hypertension    Fatty liver    Hyperlipidemia    Myopia    Ocular disease    Pseudophakia of left eye    Retinal detachment    both eyes   Shingles    Sight deterioration    right eye    Tobacco History: Social History   Tobacco Use  Smoking Status Never  Smokeless Tobacco Never   Counseling given: Not Answered  Outpatient Medications Prior to Visit  Medication Sig Dispense Refill   albuterol (VENTOLIN HFA) 108 (90 Base) MCG/ACT inhaler Inhale 2 puffs into the lungs every 6 (six) hours as needed for wheezing or shortness of breath. 8 g 0   carboxymethylcellulose (REFRESH TEARS) 0.5 % SOLN Apply to eye.     EPINEPHrine (EPIPEN 2-PAK) 0.3 mg/0.3 mL IJ SOAJ injection as directed Injection 1 each 1   latanoprost (XALATAN) 0.005 % ophthalmic solution Place 1 drop into the left eye nightly.     lisinopril (ZESTRIL) 20 MG tablet 20 mg every morning.     ondansetron (ZOFRAN-ODT) 4 MG disintegrating tablet Take 1 tablet (4 mg  total) by mouth every 8 (eight) hours as needed for nausea or vomiting. 20 tablet 1   pravastatin (PRAVACHOL) 20 MG tablet TAKE 1 Tablet BY MOUTH ONCE DAILY at bedtime 90 tablet PRN   sildenafil (REVATIO) 20 MG tablet Take 1-5 tablets PRN 90 tablet 2   amLODipine (NORVASC) 5 MG tablet TAKE ONE TABLET BY MOUTH DAILY 90 tablet 1   omeprazole (PRILOSEC) 40 MG capsule Take 1 capsule (40 mg total) by mouth daily. 30 capsule 3   benzonatate (TESSALON) 200 MG capsule Take 1 capsule (200 mg total) by mouth 3 (three) times daily as needed. (Patient not taking: Reported on 01/29/2023) 45 capsule 1   betamethasone dipropionate 0.05 % cream Apply topically. (Patient not taking: Reported on 01/29/2023)     brimonidine (ALPHAGAN) 0.2 % ophthalmic solution Apply to eye. (Patient not taking: Reported on 01/29/2023)     No facility-administered medications prior to visit.     Review of Systems:   Constitutional:   No  weight loss, night sweats,  Fevers, chills, fatigue, or  lassitude.  HEENT:   No headaches,  Difficulty swallowing,  Tooth/dental problems, or  Sore throat,                No sneezing, itching, ear ache,  +nasal congestion, post nasal drip,   CV:  No chest pain,  Orthopnea, PND, swelling in lower extremities, anasarca, dizziness, palpitations, syncope.   GI  No heartburn, indigestion, abdominal pain, nausea, vomiting, diarrhea, change in bowel habits, loss of appetite, bloody stools.   Resp: N  No chest wall deformity  Skin: no rash or lesions.  GU: no dysuria, change in color of urine, no urgency or frequency.  No flank pain, no hematuria   MS:  No joint pain or swelling.  No decreased range of motion.  No back pain.    Physical Exam  BP 120/84 (BP Location: Left Arm, Patient Position: Sitting, Cuff Size: Normal)   Pulse 79   Temp (!) 97.5 F (36.4 C) (Oral)   Ht 6' 0.5" (1.842 m)   Wt 239 lb (108.4 kg)   SpO2 98%   BMI 31.97 kg/m   GEN: A/Ox3; pleasant , NAD, well nourished     HEENT:  [place]/AT,  NOSE-clear, THROAT-clear, no lesions, no postnasal drip or exudate noted.   NECK:  Supple w/ fair ROM; no JVD; normal carotid impulses w/o bruits; no thyromegaly or nodules palpated; no lymphadenopathy.    RESP  Clear  P & A; w/o, wheezes/ rales/ or rhonchi. no accessory muscle use, no dullness to percussion  CARD:  RRR, no m/r/g, no peripheral edema, pulses intact, no cyanosis or clubbing.  GI:   Soft & nt; nml bowel sounds; no organomegaly or masses detected.   Musco: Warm bil, no deformities or joint swelling noted.  Neuro: alert, no focal deficits noted.    Skin: Warm, no lesions or rashes   BMET    ProBNP No results found for: "PROBNP"  Imaging: No results found.       Latest Ref Rng & Units 01/29/2023    8:32 AM  PFT Results  FVC-Pre L 4.54  P  FVC-Predicted Pre % 92  P  Pre FEV1/FVC % % 52  P  FEV1-Pre L 2.37  P  FEV1-Predicted Pre % 64  P  DLCO uncorrected ml/min/mmHg 18.64  P  DLCO UNC% % 65  P  DLCO corrected ml/min/mmHg 18.64  P  DLCO COR %Predicted % 65  P  DLVA Predicted % 115  P  TLC L 7.68  P  TLC % Predicted % 102  P  RV % Predicted % 35  P    P Preliminary result    No results found for: "NITRICOXIDE"      Assessment & Plan:   OSA (obstructive sleep apnea) Severe obstructive sleep apnea-patient has excellent control compliance on nocturnal CPAP.  No changes.  Plan  Patient Instructions  Continue on CPAP At bedtime, wear all night long for at least 6hr or more.  Healthy sleep regimen Do not drive if sleepy Work on healthy weight Saline nasal spray Twice daily   Saline nasal gel At bedtime     Begin Symbicort 80 2 puffs Twice daily, rinse after use.  Allegra  daily in am  Chlorpheniramine  (Chlor tabs) 1 At bedtime As needed  drainage .   Continue on Prilosec  daily  Pepcid  At bedtime   Delsym 2 tsp Twice daily  for cough , As needed   Tessalon Three times a day  for cough As needed    Saline nasal spray Twice daily   Albuterol inhaler As needed   Follow up with in 4 weeks with Spirometry and As needed   Please contact office for sooner follow up if symptoms do not improve or worsen or seek emergency care      Chronic cough Chronic cough suspect is multifactorial.  PFTs show moderate airflow obstruction patient is a never smoker suspect he has underlying asthma complicated by possible reflux and chronic rhinitis.  Will begin Symbicort.  Continue to control for cough and triggers. Will repeat spirometry on return visit.  Plan  Patient Instructions  Continue on CPAP At bedtime, wear all night long for at least 6hr or more.  Healthy sleep regimen Do not drive if sleepy Work on healthy weight Saline nasal spray Twice daily   Saline nasal gel At bedtime     Begin Symbicort 80 2 puffs Twice daily, rinse after use.  Allegra  daily in am  Chlorpheniramine  (Chlor tabs) 1 At bedtime As needed  drainage .   Continue on Prilosec  daily  Pepcid  At bedtime   Delsym 2 tsp Twice daily  for cough , As needed   Tessalon Three times a day  for cough As needed   Saline nasal spray Twice daily   Albuterol inhaler As needed   Follow up with in 4 weeks with Spirometry and As needed   Please contact office for sooner follow up if symptoms do not improve or worsen or seek emergency care        Rubye Oaks, NP 01/29/2023

## 2023-01-29 NOTE — Assessment & Plan Note (Signed)
Chronic cough suspect is multifactorial.  PFTs show moderate airflow obstruction patient is a never smoker suspect he has underlying asthma complicated by possible reflux and chronic rhinitis.  Will begin Symbicort.  Continue to control for cough and triggers. Will repeat spirometry on return visit.  Plan  Patient Instructions  Continue on CPAP At bedtime, wear all night long for at least 6hr or more.  Healthy sleep regimen Do not drive if sleepy Work on healthy weight Saline nasal spray Twice daily   Saline nasal gel At bedtime     Begin Symbicort 80 2 puffs Twice daily, rinse after use.  Allegra  daily in am  Chlorpheniramine  (Chlor tabs) 1 At bedtime As needed  drainage .   Continue on Prilosec  daily  Pepcid  At bedtime   Delsym 2 tsp Twice daily  for cough , As needed   Tessalon Three times a day  for cough As needed   Saline nasal spray Twice daily   Albuterol inhaler As needed   Follow up with in 4 weeks with Spirometry and As needed   Please contact office for sooner follow up if symptoms do not improve or worsen or seek emergency care

## 2023-01-30 NOTE — Progress Notes (Signed)
Reviewed and agree with assessment/plan.   Sarahlynn Cisnero, MD [facility] [place] Pulmonary/Critical Care 01/30/2023, 9:03 AM Pager:  [phone]  

## 2023-02-07 NOTE — Telephone Encounter (Signed)
Patient came to appointment on 4/23.  Closing encounter.

## 2023-02-14 DIAGNOSIS — G4733 Obstructive sleep apnea (adult) (pediatric): Secondary | ICD-10-CM | POA: Diagnosis not present

## 2023-02-25 ENCOUNTER — Other Ambulatory Visit (HOSPITAL_COMMUNITY): Payer: Self-pay

## 2023-02-25 ENCOUNTER — Telehealth: Payer: Self-pay

## 2023-02-25 NOTE — Telephone Encounter (Signed)
PA request received via CMM for Symbicort 80-4.5MCG/ACT aerosol  PA submitted to [employer] and is pending determination  *patient has not tried any alternatives  Key: [id number]

## 2023-02-26 ENCOUNTER — Ambulatory Visit: Payer: PPO | Admitting: Adult Health

## 2023-02-26 ENCOUNTER — Encounter: Payer: Self-pay | Admitting: Adult Health

## 2023-02-26 VITALS — BP 110/80 | HR 74 | Ht 71.5 in | Wt 238.6 lb

## 2023-02-26 DIAGNOSIS — R053 Chronic cough: Secondary | ICD-10-CM | POA: Diagnosis not present

## 2023-02-26 DIAGNOSIS — G4733 Obstructive sleep apnea (adult) (pediatric): Secondary | ICD-10-CM

## 2023-02-26 MED ORDER — FLUTICASONE-SALMETEROL 100-50 MCG/ACT IN AEPB: 1.0000 | INHALATION_SPRAY | Freq: Two times a day (BID) | RESPIRATORY_TRACT | 3 refills | Status: DC

## 2023-02-26 NOTE — Telephone Encounter (Signed)
FYI

## 2023-02-26 NOTE — Assessment & Plan Note (Signed)
Severe obstructive sleep apnea with excellent control and compliance on CPAP with perceived benefit-no changes   Plan  Patient Instructions  Finish Symbicort inhaler.  Begin Advair 1 puff Twice daily, rinse after use.  Discuss with Primary Provider that Lisinopril may be aggravating your cough and need to be changed.  Allegra 180mg  daily in am as needed.  Chlorpheniramine 4mg  (Chlor tabs) 1 At bedtime As needed drainage .   Continue on Prilosec 20mg  daily  Pepcid 20mg  At bedtime   Delsym 2 tsp Twice daily  for cough , As needed   Tessalon Three times a day  for cough As needed   Saline nasal spray Twice daily   Albuterol inhaler As needed    Continue on CPAP At bedtime   Keep up good work.  Follow up with in 3 months , will check spirometry on return.  Please contact office for sooner follow up if symptoms do not improve or worsen or seek emergency care

## 2023-02-26 NOTE — Patient Instructions (Addendum)
Finish Symbicort inhaler.  Begin Advair 1 puff Twice daily, rinse after use.  Discuss with Primary Provider that Lisinopril may be aggravating your cough and need to be changed.  Allegra 180mg  daily in am as needed.  Chlorpheniramine 4mg  (Chlor tabs) 1 At bedtime As needed drainage .   Continue on Prilosec 20mg  daily  Pepcid 20mg  At bedtime   Delsym 2 tsp Twice daily  for cough , As needed   Tessalon Three times a day  for cough As needed   Saline nasal spray Twice daily   Albuterol inhaler As needed    Continue on CPAP At bedtime   Keep up good work.  Follow up with in 3 months , will check spirometry /Feno on return.  Please contact office for sooner follow up if symptoms do not improve or worsen or seek emergency care

## 2023-02-26 NOTE — Progress Notes (Signed)
Reviewed and agree with assessment/plan.   Coralyn Helling, MD [facility] Pulmonary/Critical Care 02/26/2023, 10:18 AM Pager:  [phone]

## 2023-02-26 NOTE — Assessment & Plan Note (Signed)
Chronic cough suspect is multifactorial with underlying asthma, chronic rhinitis and GERD.  Patient is had substantial improvement with current regimen.  Suspect ACE inhibitor is aggravating chronic cough.  Would benefit from from changing ACE inhibitor if possible.  Plan  Patient Instructions  Finish Symbicort inhaler.  Begin Advair 1 puff Twice daily, rinse after use.  Discuss with Primary Provider that Lisinopril may be aggravating your cough and need to be changed.  Allegra 180mg  daily in am as needed.  Chlorpheniramine 4mg  (Chlor tabs) 1 At bedtime As needed drainage .   Continue on Prilosec 20mg  daily  Pepcid 20mg  At bedtime   Delsym 2 tsp Twice daily  for cough , As needed   Tessalon Three times a day  for cough As needed   Saline nasal spray Twice daily   Albuterol inhaler As needed    Continue on CPAP At bedtime   Keep up good work.  Follow up with in 3 months , will check spirometry on return.  Please contact office for sooner follow up if symptoms do not improve or worsen or seek emergency care

## 2023-02-26 NOTE — Progress Notes (Signed)
@Patient  ID: Raymond Mann, male    DOB: 31-Jan-1956, 67 y.o.   MRN: [id number]  Chief Complaint  Patient presents with   Follow-up    Referring provider: [name], NP  HPI: 67 year old male never smoker seen for consult February 16 for snoring found to have very severe sleep apnea and chronic cough felt to have underlying moderate persistent asthma  TEST/EVENTS :  home sleep study December 06, 2022 that showed severe sleep apnea with AHI at 68/hour and SpO2 low at 73%.     01/29/23 PFTs moderate airflow obstruction with an FEV1 at 64%, ratio at 52, FVC 92%, DLCO 65%.  Chest xray 11/23/22 Clear   02/26/2023 Follow up ; OSA, Cough and Asthma  Patient presents for 1 month follow-up.  Patient has had a chronic cough for years.  Frequently treated for recurrent bronchitis with antibiotics and steroids.  Cough is worse at night.  Treated for reflux and rhinitis induced cough . PFTs done last visit showed moderate airflow obstruction with FEV1 at 64% ratio at 52 consistent with asthma as he is a never smoker.   Patient was started on Symbicort.  Patient says since last visit he is doing well.  Cough has definitely improved substantially.  Not totally gone away but much improved.  Feels that his breathing is better also has more energy since starting CPAP therapy.  Denies any significant wheezing. He remains on Allegra in a.m. and chlor tabs at bedtime.  Is on Prilosec and Pepcid.  No longer having to use cough medicines. Does need to change his Symbicort inhaler as insurance is not going to cover.  He did bring formulary coverage and Advair discus is on formulary. We did discuss last visit that he is on an ACE inhibitor which can aggravate chronic cough.  Patient has severe obstructive sleep apnea.  Sleep study in February showed severe sleep apnea with AHI at 68/hour and SpO2 low at 73%.  Patient was started on CPAP.  Patient says it is made a dramatic difference in his daytime sleepiness  and energy level.  Feels that he benefits from CPAP.  CPAP download shows excellent compliance with daily average usage at 9 hours.  Patient has 100% compliance.  He is on auto CPAP 5 to 15 cm H2O daily average pressure at 9 cm H2O.  AHI 3.5/hour.   Allergies  Allergen Reactions   Bee Venom Swelling   Lisinopril Cough   Nutritional Supplements Other (See Comments)    Itching, possibly    Immunization History  Administered Date(s) Administered   Fluad Quad(high Dose 65+) 07/28/2020, 07/20/2022   Influenza, Quadrivalent, Recombinant, Inj, Pf 07/26/2018   Influenza,inj,Quad PF,6+ Mos 08/10/2017   Influenza-Unspecified 07/08/2021   PFIZER(Purple Top)SARS-COV-2 Vaccination 10/28/2019, 11/18/2019, 07/08/2020   PPD Test 04/03/2019   Pneumococcal Conjugate-13 07/23/2019   Pneumococcal Polysaccharide-23 07/28/2020   Tdap 04/02/2014   Zoster Recombinat (Shingrix) 06/01/2019, 08/29/2019   Zoster, Live 09/25/2017    Past Medical History:  Diagnosis Date   Cataract of right eye    ED (erectile dysfunction)    Essential hypertension    Fatty liver    Hyperlipidemia    Myopia    Ocular disease    Pseudophakia of left eye    Retinal detachment    both eyes   Shingles    Sight deterioration    right eye    Tobacco History: Social History   Tobacco Use  Smoking Status Never  Smokeless Tobacco Never  Counseling given: Not Answered   Outpatient Medications Prior to Visit  Medication Sig Dispense Refill   albuterol (VENTOLIN HFA) 108 (90 Base) MCG/ACT inhaler Inhale 2 puffs into the lungs every 6 (six) hours as needed for wheezing or shortness of breath. 8 g 0   amLODipine (NORVASC) 5 MG tablet TAKE ONE TABLET BY MOUTH DAILY 90 tablet PRN   benzonatate (TESSALON) 200 MG capsule Take 1 capsule (200 mg total) by mouth 3 (three) times daily as needed. 45 capsule 1   carboxymethylcellulose (REFRESH TEARS) 0.5 % SOLN Apply to eye.     EPINEPHrine (EPIPEN 2-PAK) 0.3 mg/0.3 mL IJ SOAJ  injection as directed Injection 1 each 1   latanoprost (XALATAN) 0.005 % ophthalmic solution Place 1 drop into the left eye nightly.     lisinopril (ZESTRIL) 20 MG tablet 20 mg every morning.     omeprazole (PRILOSEC) 40 MG capsule TAKE 1 Capsule BY MOUTH ONCE DAILY 30 capsule 3   ondansetron (ZOFRAN-ODT) 4 MG disintegrating tablet Take 1 tablet (4 mg total) by mouth every 8 (eight) hours as needed for nausea or vomiting. 20 tablet 1   pravastatin (PRAVACHOL) 20 MG tablet TAKE 1 Tablet BY MOUTH ONCE DAILY at bedtime 90 tablet PRN   sildenafil (REVATIO) 20 MG tablet Take 1-5 tablets PRN 90 tablet 2   budesonide-formoterol (BREYNA) 80-4.5 MCG/ACT inhaler Inhale 2 puffs into the lungs 2 (two) times daily.     budesonide-formoterol (SYMBICORT) 80-4.5 MCG/ACT inhaler Inhale 2 puffs into the lungs 2 (two) times daily. (Patient not taking: Reported on 02/26/2023) 1 each 12   No facility-administered medications prior to visit.     Review of Systems:   Constitutional:   No  weight loss, night sweats,  Fevers, chills, fatigue, or  lassitude.  HEENT:   No headaches,  Difficulty swallowing,  Tooth/dental problems, or  Sore throat,                No sneezing, itching, ear ache, nasal congestion, post nasal drip,   CV:  No chest pain,  Orthopnea, PND, swelling in lower extremities, anasarca, dizziness, palpitations, syncope.   GI  No heartburn, indigestion, abdominal pain, nausea, vomiting, diarrhea, change in bowel habits, loss of appetite, bloody stools.   Resp: .  No chest wall deformity  Skin: no rash or lesions.  GU: no dysuria, change in color of urine, no urgency or frequency.  No flank pain, no hematuria   MS:  No joint pain or swelling.  No decreased range of motion.  No back pain.    Physical Exam  BP 110/80 (BP Location: Left Arm, Patient Position: Sitting, Cuff Size: Normal)   Pulse 74   Ht 5' 11.5" (1.816 m)   Wt 238 lb 9.6 oz (108.2 kg)   SpO2 95%   BMI 32.81 kg/m   GEN:  A/Ox3; pleasant , NAD, well nourished    HEENT:  [place]/AT, NOSE-clear, THROAT-clear, no lesions, no postnasal drip or exudate noted.   NECK:  Supple w/ fair ROM; no JVD; normal carotid impulses w/o bruits; no thyromegaly or nodules palpated; no lymphadenopathy.    RESP  Clear  P & A; w/o, wheezes/ rales/ or rhonchi. no accessory muscle use, no dullness to percussion  CARD:  RRR, no m/r/g, no peripheral edema, pulses intact, no cyanosis or clubbing.  GI:   Soft & nt; nml bowel sounds; no organomegaly or masses detected.   Musco: Warm bil, no deformities or joint swelling noted.  Neuro: alert, no focal deficits noted.    Skin: Warm, no lesions or rashes    Lab Results:    BMET    Component Value Date/Time   NA 140 11/08/2022 0749   K 4.4 11/08/2022 0749   CL 105 11/08/2022 0749   CO2 24 11/08/2022 0749   GLUCOSE 105 (H) 11/08/2022 0749   BUN 17 11/08/2022 0749   CREATININE 1.03 11/08/2022 0749   CREATININE 1.05 09/26/2020 0841   CALCIUM 8.9 11/08/2022 0749   GFRNONAA >60 09/01/2018 1107   GFRAA >60 09/01/2018 1107    BNP    Component Value Date/Time   BNP 20.5 09/01/2018 1107    ProBNP No results found for: "PROBNP"  Imaging: No results found.       Latest Ref Rng & Units 01/29/2023    8:32 AM  PFT Results  FVC-Pre L 4.54   FVC-Predicted Pre % 92   Pre FEV1/FVC % % 52   FEV1-Pre L 2.37   FEV1-Predicted Pre % 64   DLCO uncorrected ml/min/mmHg 18.64   DLCO UNC% % 65   DLCO corrected ml/min/mmHg 18.64   DLCO COR %Predicted % 65   DLVA Predicted % 115   TLC L 7.68   TLC % Predicted % 102   RV % Predicted % 35     No results found for: "NITRICOXIDE"      Assessment & Plan:   OSA (obstructive sleep apnea) Severe obstructive sleep apnea with excellent control and compliance on CPAP with perceived benefit-no changes   Plan  Patient Instructions  Finish Symbicort inhaler.  Begin Advair 1 puff Twice daily, rinse after use.  Discuss with Primary  Provider that Lisinopril may be aggravating your cough and need to be changed.  Allegra 180mg  daily in am as needed.  Chlorpheniramine 4mg  (Chlor tabs) 1 At bedtime As needed drainage .   Continue on Prilosec 20mg  daily  Pepcid 20mg  At bedtime   Delsym 2 tsp Twice daily  for cough , As needed   Tessalon Three times a day  for cough As needed   Saline nasal spray Twice daily   Albuterol inhaler As needed    Continue on CPAP At bedtime   Keep up good work.  Follow up with in 3 months , will check spirometry on return.  Please contact office for sooner follow up if symptoms do not improve or worsen or seek emergency care      Chronic cough Chronic cough suspect is multifactorial with underlying asthma, chronic rhinitis and GERD.  Patient is had substantial improvement with current regimen.  Suspect ACE inhibitor is aggravating chronic cough.  Would benefit from from changing ACE inhibitor if possible.  Plan  Patient Instructions  Finish Symbicort inhaler.  Begin Advair 1 puff Twice daily, rinse after use.  Discuss with Primary Provider that Lisinopril may be aggravating your cough and need to be changed.  Allegra 180mg  daily in am as needed.  Chlorpheniramine 4mg  (Chlor tabs) 1 At bedtime As needed drainage .   Continue on Prilosec 20mg  daily  Pepcid 20mg  At bedtime   Delsym 2 tsp Twice daily  for cough , As needed   Tessalon Three times a day  for cough As needed   Saline nasal spray Twice daily   Albuterol inhaler As needed    Continue on CPAP At bedtime   Keep up good work.  Follow up with in 3 months , will check spirometry on return.  Please contact office  for sooner follow up if symptoms do not improve or worsen or seek emergency care       Rubye Oaks, NP 02/26/2023

## 2023-02-27 ENCOUNTER — Other Ambulatory Visit: Payer: Self-pay | Admitting: Adult Health

## 2023-02-27 DIAGNOSIS — H52203 Unspecified astigmatism, bilateral: Secondary | ICD-10-CM | POA: Diagnosis not present

## 2023-02-27 DIAGNOSIS — H33023 Retinal detachment with multiple breaks, bilateral: Secondary | ICD-10-CM | POA: Diagnosis not present

## 2023-02-27 DIAGNOSIS — H40053 Ocular hypertension, bilateral: Secondary | ICD-10-CM | POA: Diagnosis not present

## 2023-02-27 DIAGNOSIS — H524 Presbyopia: Secondary | ICD-10-CM | POA: Diagnosis not present

## 2023-02-27 DIAGNOSIS — Z961 Presence of intraocular lens: Secondary | ICD-10-CM | POA: Diagnosis not present

## 2023-02-27 MED ORDER — AMLODIPINE BESYLATE 10 MG PO TABS: 10.0000 mg | ORAL_TABLET | Freq: Every day | ORAL | 0 refills | Status: DC

## 2023-02-27 NOTE — Telephone Encounter (Signed)
Pulm

## 2023-03-06 ENCOUNTER — Encounter: Payer: Self-pay | Admitting: Adult Health

## 2023-03-06 ENCOUNTER — Telehealth (INDEPENDENT_AMBULATORY_CARE_PROVIDER_SITE_OTHER): Payer: PPO | Admitting: Adult Health

## 2023-03-06 ENCOUNTER — Ambulatory Visit (INDEPENDENT_AMBULATORY_CARE_PROVIDER_SITE_OTHER): Payer: PPO | Admitting: Vascular Surgery

## 2023-03-06 VITALS — BP 129/82

## 2023-03-06 VITALS — BP 129/82 | HR 89 | Temp 98.4°F | Resp 20 | Ht 71.5 in | Wt 239.8 lb

## 2023-03-06 DIAGNOSIS — I8390 Asymptomatic varicose veins of unspecified lower extremity: Secondary | ICD-10-CM

## 2023-03-06 DIAGNOSIS — I872 Venous insufficiency (chronic) (peripheral): Secondary | ICD-10-CM | POA: Diagnosis not present

## 2023-03-06 DIAGNOSIS — I10 Essential (primary) hypertension: Secondary | ICD-10-CM

## 2023-03-06 DIAGNOSIS — R600 Localized edema: Secondary | ICD-10-CM

## 2023-03-06 MED ORDER — OLMESARTAN MEDOXOMIL 5 MG PO TABS
5.0000 mg | ORAL_TABLET | Freq: Every day | ORAL | 0 refills | Status: DC
Start: 2023-03-06 — End: 2023-03-29

## 2023-03-06 NOTE — Progress Notes (Signed)
Virtual Visit via Video Note  I connected with Raymond Mann on 03/06/23 at  2:00 PM EDT by a video enabled telemedicine application and verified that I am speaking with the correct person using two identifiers.  Location patient: home Location provider:work or home office Persons participating in the virtual visit: patient, provider  I discussed the limitations of evaluation and management by telemedicine and the availability of in person appointments. The patient expressed understanding and agreed to proceed.   HPI:  67 year old male who is being evaluated today for follow-up regarding hypertension.  We recently increased his Norvasc from 5 mg to 10 mg.  He reports that about 48 hours after increasing the dose of amlodipine that he experienced bilateral lower extremity edema.  He stopped taking Norvasc about 5 days ago and the edema has resolved.  He is not 100% and on whether he developed edema on the 5 mg dose.  He would like to plate safe and try switching his blood pressure medication.  Overall he is feeling well, he has been started on CPAP for the last 2 months and is feeling much better.  BP Readings from Last 3 Encounters:  03/06/23 129/82  03/06/23 129/82  02/26/23 110/80     ROS: See pertinent positives and negatives per HPI.  Past Medical History:  Diagnosis Date   Cataract of right eye    ED (erectile dysfunction)    Essential hypertension    Fatty liver    Hyperlipidemia    Myopia    Ocular disease    Pseudophakia of left eye    Retinal detachment    both eyes   Shingles    Sight deterioration    right eye    Past Surgical History:  Procedure Laterality Date   bilateral retinal detachment     x2 each eye   CAPSULOTOMY  2001   left eye   cryopexy  1995   right eye   scleral buckling  1995   left eye   VITRECTOMY  1995   both eyes    Family History  Problem Relation Age of Onset   Stroke Father    Heart attack Father    Dementia Mother    Stomach  cancer Mother 67   Retinal detachment Brother        Current Outpatient Medications:    albuterol (VENTOLIN HFA) 108 (90 Base) MCG/ACT inhaler, Inhale 2 puffs into the lungs every 6 (six) hours as needed for wheezing or shortness of breath., Disp: 8 g, Rfl: 0   carboxymethylcellulose (REFRESH TEARS) 0.5 % SOLN, Apply to eye., Disp: , Rfl:    EPINEPHrine (EPIPEN 2-PAK) 0.3 mg/0.3 mL IJ SOAJ injection, as directed Injection, Disp: 1 each, Rfl: 1   fluticasone-salmeterol (ADVAIR DISKUS) 100-50 MCG/ACT AEPB, Inhale 1 puff into the lungs 2 (two) times daily., Disp: 3 each, Rfl: 3   latanoprost (XALATAN) 0.005 % ophthalmic solution, Place 1 drop into the left eye nightly., Disp: , Rfl:    omeprazole (PRILOSEC) 40 MG capsule, TAKE 1 Capsule BY MOUTH ONCE DAILY, Disp: 30 capsule, Rfl: 3   OVER THE COUNTER MEDICATION, Occuvite-daily, Disp: , Rfl:    pravastatin (PRAVACHOL) 20 MG tablet, TAKE 1 Tablet BY MOUTH ONCE DAILY at bedtime, Disp: 90 tablet, Rfl: PRN   Probiotic Product (PROBIOTIC PO), Take by mouth., Disp: , Rfl:    sildenafil (REVATIO) 20 MG tablet, Take 1-5 tablets PRN, Disp: 90 tablet, Rfl: 2   VITAMIN D PO, Take 250 mg  by mouth daily., Disp: , Rfl:   EXAM:  VITALS per patient if applicable:  GENERAL: alert, oriented, appears well and in no acute distress  HEENT: atraumatic, conjunttiva clear, no obvious abnormalities on inspection of external nose and ears  NECK: normal movements of the head and neck  LUNGS: on inspection no signs of respiratory distress, breathing rate appears normal, no obvious gross SOB, gasping or wheezing  CV: no obvious cyanosis  MS: moves all visible extremities without noticeable abnormality  PSYCH/NEURO: pleasant and cooperative, no obvious depression or anxiety, speech and thought processing grossly intact  ASSESSMENT AND PLAN:  Discussed the following assessment and plan:  1. Essential hypertension - Will start on low dose Benicar.  Will have  him monitor his blood pressures at home and follow-up in 1 month.  He knows to notify me if blood pressures start increasing above 140 or 150 systolic. - olmesartan (BENICAR) 5 MG tablet; Take 1 tablet (5 mg total) by mouth daily.  Dispense: 30 tablet; Refill: 0  2. Lower extremity edema - Resolved       I discussed the assessment and treatment plan with the patient. The patient was provided an opportunity to ask questions and all were answered. The patient agreed with the plan and demonstrated an understanding of the instructions.   The patient was advised to call back or seek an in-person evaluation if the symptoms worsen or if the condition fails to improve as anticipated.   [name], NP

## 2023-03-06 NOTE — Progress Notes (Signed)
Patient ID: Raymond Mann, male   DOB: [birth year], 67 y.o.   MRN: [id number]  Reason for Consult: Follow-up   Referred by [name], NP  Subjective:     HPI:  Raymond Mann is a 67 y.o. male without significant vascular disease does have hyperlipidemia hypertension.  Was seen in our office last January for varicose veins in the left lower extremity with itching and pain of the left leg with associated swelling.  He has now been wearing thigh-high compression stockings which she states does help the symptoms but they are very worn and as such he mostly wears shorts.  He does not have any right leg symptoms.  Past Medical History:  Diagnosis Date   Cataract of right eye    ED (erectile dysfunction)    Essential hypertension    Fatty liver    Hyperlipidemia    Myopia    Ocular disease    Pseudophakia of left eye    Retinal detachment    both eyes   Shingles    Sight deterioration    right eye   Family History  Problem Relation Age of Onset   Stroke Father    Heart attack Father    Dementia Mother    Stomach cancer Mother 66   Retinal detachment Brother    Past Surgical History:  Procedure Laterality Date   bilateral retinal detachment     x2 each eye   CAPSULOTOMY  2001   left eye   cryopexy  1995   right eye   scleral buckling  1995   left eye   VITRECTOMY  1995   both eyes    Short Social History:  Social History   Tobacco Use   Smoking status: Never   Smokeless tobacco: Never  Substance Use Topics   Alcohol use: Yes    Comment: occasionally    Allergies  Allergen Reactions   Bee Venom Swelling   Lisinopril Cough   Nutritional Supplements Other (See Comments)    Itching, possibly    Current Outpatient Medications  Medication Sig Dispense Refill   albuterol (VENTOLIN HFA) 108 (90 Base) MCG/ACT inhaler Inhale 2 puffs into the lungs every 6 (six) hours as needed for wheezing or shortness of breath. 8 g 0   amLODipine (NORVASC) 10 MG tablet  Take 1 tablet (10 mg total) by mouth daily. 30 tablet 0   carboxymethylcellulose (REFRESH TEARS) 0.5 % SOLN Apply to eye.     EPINEPHrine (EPIPEN 2-PAK) 0.3 mg/0.3 mL IJ SOAJ injection as directed Injection 1 each 1   fluticasone-salmeterol (ADVAIR DISKUS) 100-50 MCG/ACT AEPB Inhale 1 puff into the lungs 2 (two) times daily. 3 each 3   latanoprost (XALATAN) 0.005 % ophthalmic solution Place 1 drop into the left eye nightly.     omeprazole (PRILOSEC) 40 MG capsule TAKE 1 Capsule BY MOUTH ONCE DAILY 30 capsule 3   pravastatin (PRAVACHOL) 20 MG tablet TAKE 1 Tablet BY MOUTH ONCE DAILY at bedtime 90 tablet PRN   sildenafil (REVATIO) 20 MG tablet Take 1-5 tablets PRN 90 tablet 2   No current facility-administered medications for this visit.    Review of Systems  Constitutional:  Constitutional negative. HENT: HENT negative.  Eyes: Eyes negative.  Respiratory: Respiratory negative.  Cardiovascular: Positive for leg swelling.  GI: Gastrointestinal negative.  Musculoskeletal: Positive for leg pain.  Neurological: Neurological negative. Psychiatric: Psychiatric negative.        Objective:  Objective   Vitals:  03/06/23 0916  BP: 129/82  Pulse: 89  Resp: 20  Temp: 98.4 F (36.9 C)  SpO2: 94%  Weight: 239 lb 12.8 oz (108.8 kg)  Height: 5' 11.5" (1.816 m)   Body mass index is 32.98 kg/m.  Physical Exam HENT:     Head: Normocephalic.     Nose: Nose normal.  Eyes:     Pupils: Pupils are equal, round, and reactive to light.  Cardiovascular:     Pulses: Normal pulses.  Pulmonary:     Effort: Pulmonary effort is normal.  Abdominal:     General: Abdomen is flat.     Palpations: Abdomen is soft.  Musculoskeletal:     Right lower leg: No edema.     Left lower leg: Edema present.  Skin:    General: Skin is warm.     Capillary Refill: Capillary refill takes less than 2 seconds.  Neurological:     General: No focal deficit present.     Mental Status: He is alert.   Psychiatric:        Mood and Affect: Mood normal.        Behavior: Behavior normal.        Thought Content: Thought content normal.        Judgment: Judgment normal.     Data: LEFT          Reflux NoRefluxReflux TimeDiameter cmsComments                          Yes                                   +--------------+---------+------+-----------+------------+--------+  CFV                    yes   >1 second                       +--------------+---------+------+-----------+------------+--------+  FV mid        no                                              +--------------+---------+------+-----------+------------+--------+  Popliteal              yes   >1 second                       +--------------+---------+------+-----------+------------+--------+  GSV at SFJ              yes    >500 ms      0.53              +--------------+---------+------+-----------+------------+--------+  GSV prox thigh          yes    >500 ms      0.45              +--------------+---------+------+-----------+------------+--------+  GSV mid thigh no                            0.45              +--------------+---------+------+-----------+------------+--------+  GSV dist thigh          yes    >500 ms  0.27              +--------------+---------+------+-----------+------------+--------+  GSV at knee   no                            0.36              +--------------+---------+------+-----------+------------+--------+  GSV prox calf           yes    >500 ms      0.29              +--------------+---------+------+-----------+------------+--------+  SSV Pop Fossa           yes    >500 ms      0.66              +--------------+---------+------+-----------+------------+--------+  SSV prox calf           yes    >500 ms      0.62              +--------------+---------+------+-----------+------------+--------+  SSV mid calf             yes    >500 ms      0.52              +--------------+---------+------+-----------+------------+--------+        Summary:  Left:  - No evidence of deep vein thrombosis seen in the left lower extremity,  from the common femoral through the popliteal veins.  - No evidence of superficial venous thrombosis in the left lower  extremity.  - The deep venous system is incompetent at the common femoral vein and  popliteal vein.  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  - The small saphenous vein is incompetent.         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  We evaluated his small saphenous vein today at the bedside with ultrasound which is very large and gives rise to multiple varicosities clustered in the posterior medial left calf and also a few that attach to that cluster on the anterior left leg.  I discussed that these are at low risk of ever thrombosis and causing deep venous thrombosis but that given his symptoms with the veins as well as swelling he is a good candidate for small saphenous vein ablation and would also benefit from stab phlebectomy between 10 and 20.  Patient demonstrates good understanding.  He will continue to wear thigh-high compression stockings we will get him scheduled in the near future.     Maeola Harman MD [facility]

## 2023-03-08 ENCOUNTER — Other Ambulatory Visit (HOSPITAL_COMMUNITY): Payer: Self-pay

## 2023-03-08 NOTE — Telephone Encounter (Signed)
PA denied, med changed to Advair Diskus

## 2023-03-11 ENCOUNTER — Other Ambulatory Visit: Payer: Self-pay | Admitting: Adult Health

## 2023-03-11 DIAGNOSIS — K2100 Gastro-esophageal reflux disease with esophagitis, without bleeding: Secondary | ICD-10-CM

## 2023-03-20 ENCOUNTER — Other Ambulatory Visit: Payer: Self-pay | Admitting: *Deleted

## 2023-03-20 DIAGNOSIS — I83812 Varicose veins of left lower extremities with pain: Secondary | ICD-10-CM

## 2023-03-29 ENCOUNTER — Telehealth (INDEPENDENT_AMBULATORY_CARE_PROVIDER_SITE_OTHER): Payer: PPO | Admitting: Adult Health

## 2023-03-29 ENCOUNTER — Encounter: Payer: Self-pay | Admitting: Adult Health

## 2023-03-29 DIAGNOSIS — I10 Essential (primary) hypertension: Secondary | ICD-10-CM

## 2023-03-29 MED ORDER — OLMESARTAN MEDOXOMIL 5 MG PO TABS: 5.0000 mg | ORAL_TABLET | Freq: Every day | ORAL | 3 refills | Status: DC

## 2023-03-29 NOTE — Progress Notes (Signed)
Virtual Visit via Video Note  I connected with Raymond Mann on 03/29/23 at 11:30 AM EDT by a video enabled telemedicine application and verified that I am speaking with the correct person using two identifiers.  Location patient: home Location provider:work or home office Persons participating in the virtual visit: patient, provider  I discussed the limitations of evaluation and management by telemedicine and the availability of in person appointments. The patient expressed understanding and agreed to proceed.   HPI:  67 year old male who  has a past medical history of Cataract of right eye, ED (erectile dysfunction), Essential hypertension, Fatty liver, Hyperlipidemia, Myopia, Ocular disease, Pseudophakia of left eye, Retinal detachment, Shingles, and Sight deterioration.  He is being evaluated today for 1 month follow-up regarding hypertension.  Last month we started him on Benicar 5 mg due to lower extremity swelling with Norvasc.  He is also been on lisinopril in the past but this was stopped due to a cough.  Since starting Benicar pressures have been averaging 120/80, low blood pressure reading of 109/80 and a high blood pressure reading of 134/80.  He denies any side effects.  Has not had any dizziness lightheadedness, chest pain, or shortness of breath. ROS: See pertinent positives and negatives per HPI.  Past Medical History:  Diagnosis Date   Cataract of right eye    ED (erectile dysfunction)    Essential hypertension    Fatty liver    Hyperlipidemia    Myopia    Ocular disease    Pseudophakia of left eye    Retinal detachment    both eyes   Shingles    Sight deterioration    right eye    Past Surgical History:  Procedure Laterality Date   bilateral retinal detachment     x2 each eye   CAPSULOTOMY  2001   left eye   cryopexy  1995   right eye   scleral buckling  1995   left eye   VITRECTOMY  1995   both eyes    Family History  Problem Relation Age of Onset    Stroke Father    Heart attack Father    Dementia Mother    Stomach cancer Mother 73   Retinal detachment Brother        Current Outpatient Medications:    albuterol (VENTOLIN HFA) 108 (90 Base) MCG/ACT inhaler, Inhale 2 puffs into the lungs every 6 (six) hours as needed for wheezing or shortness of breath., Disp: 8 g, Rfl: 0   carboxymethylcellulose (REFRESH TEARS) 0.5 % SOLN, Apply to eye., Disp: , Rfl:    EPINEPHrine (EPIPEN 2-PAK) 0.3 mg/0.3 mL IJ SOAJ injection, as directed Injection, Disp: 1 each, Rfl: 1   fluticasone-salmeterol (ADVAIR DISKUS) 100-50 MCG/ACT AEPB, Inhale 1 puff into the lungs 2 (two) times daily., Disp: 3 each, Rfl: 3   latanoprost (XALATAN) 0.005 % ophthalmic solution, Place 1 drop into the left eye nightly., Disp: , Rfl:    olmesartan (BENICAR) 5 MG tablet, Take 1 tablet (5 mg total) by mouth daily., Disp: 30 tablet, Rfl: 0   omeprazole (PRILOSEC) 40 MG capsule, TAKE 1 Capsule BY MOUTH ONCE DAILY, Disp: 30 capsule, Rfl: 3   OVER THE COUNTER MEDICATION, Occuvite-daily, Disp: , Rfl:    pravastatin (PRAVACHOL) 20 MG tablet, TAKE 1 Tablet BY MOUTH ONCE DAILY at bedtime, Disp: 90 tablet, Rfl: PRN   Probiotic Product (PROBIOTIC PO), Take by mouth., Disp: , Rfl:    sildenafil (REVATIO) 20 MG tablet,  Take 1-5 tablets PRN, Disp: 90 tablet, Rfl: 2   VITAMIN D PO, Take 250 mg by mouth daily., Disp: , Rfl:   EXAM:  VITALS per patient if applicable:  GENERAL: alert, oriented, appears well and in no acute distress  HEENT: atraumatic, conjunttiva clear, no obvious abnormalities on inspection of external nose and ears  NECK: normal movements of the head and neck  LUNGS: on inspection no signs of respiratory distress, breathing rate appears normal, no obvious gross SOB, gasping or wheezing  CV: no obvious cyanosis  MS: moves all visible extremities without noticeable abnormality  PSYCH/NEURO: pleasant and cooperative, no obvious depression or anxiety, speech and  thought processing grossly intact  ASSESSMENT AND PLAN:  Discussed the following assessment and plan:  1. Essential hypertension - At goal, no change in medication  - olmesartan (BENICAR) 5 MG tablet; Take 1 tablet (5 mg total) by mouth daily.  Dispense: 90 tablet; Refill: 3      I discussed the assessment and treatment plan with the patient. The patient was provided an opportunity to ask questions and all were answered. The patient agreed with the plan and demonstrated an understanding of the instructions.   The patient was advised to call back or seek an in-person evaluation if the symptoms worsen or if the condition fails to improve as anticipated.   [name], NP

## 2023-04-02 ENCOUNTER — Ambulatory Visit: Payer: PPO | Admitting: Adult Health

## 2023-04-04 ENCOUNTER — Ambulatory Visit: Payer: PPO | Admitting: Adult Health

## 2023-04-17 DIAGNOSIS — G4733 Obstructive sleep apnea (adult) (pediatric): Secondary | ICD-10-CM | POA: Diagnosis not present

## 2023-04-25 ENCOUNTER — Other Ambulatory Visit: Payer: Self-pay | Admitting: *Deleted

## 2023-04-25 DIAGNOSIS — G4733 Obstructive sleep apnea (adult) (pediatric): Secondary | ICD-10-CM | POA: Diagnosis not present

## 2023-04-25 MED ORDER — LORAZEPAM 1 MG PO TABS: ORAL_TABLET | ORAL | 0 refills | Status: DC

## 2023-05-02 ENCOUNTER — Ambulatory Visit (INDEPENDENT_AMBULATORY_CARE_PROVIDER_SITE_OTHER): Payer: PPO | Admitting: Vascular Surgery

## 2023-05-02 ENCOUNTER — Encounter: Payer: Self-pay | Admitting: Vascular Surgery

## 2023-05-02 VITALS — BP 138/94 | HR 103 | Temp 97.7°F | Resp 16 | Ht 71.5 in | Wt 239.0 lb

## 2023-05-02 DIAGNOSIS — I83812 Varicose veins of left lower extremities with pain: Secondary | ICD-10-CM

## 2023-05-02 HISTORY — PX: ENDOVENOUS ABLATION SAPHENOUS VEIN W/ LASER: SUR449

## 2023-05-02 NOTE — Progress Notes (Signed)
Patient name: Raymond Mann MRN: [id number] DOB: [birth year] Sex: male  REASON FOR VISIT: Left small saphenous vein ablation stab phlebectomy  HPI: Raymond Mann is a 67 y.o. male history of C3 venous disease with symptomatic varicosities associated with very large refluxing small saphenous vein.  He has been compliant with thigh-high compression stockings and we have discussed proceeding with small saphenous vein ablation and stab phlebectomy between 10 and 20.  We discussed the risk benefits alternatives and he demonstrates good understanding and desires to proceed today.  He has had Ativan prior to arrival.  Current Outpatient Medications  Medication Sig Dispense Refill   albuterol (VENTOLIN HFA) 108 (90 Base) MCG/ACT inhaler Inhale 2 puffs into the lungs every 6 (six) hours as needed for wheezing or shortness of breath. 8 g 0   carboxymethylcellulose (REFRESH TEARS) 0.5 % SOLN Apply to eye.     EPINEPHrine (EPIPEN 2-PAK) 0.3 mg/0.3 mL IJ SOAJ injection as directed Injection 1 each 1   fluticasone-salmeterol (ADVAIR DISKUS) 100-50 MCG/ACT AEPB Inhale 1 puff into the lungs 2 (two) times daily. 3 each 3   latanoprost (XALATAN) 0.005 % ophthalmic solution Place 1 drop into the left eye nightly.     LORazepam (ATIVAN) 1 MG tablet Take 2 tablets 30 minutes prior to leaving house on day of office surgery.  Bring third tablet with you to office on day of office surgery. 3 tablet 0   olmesartan (BENICAR) 5 MG tablet Take 1 tablet (5 mg total) by mouth daily. 90 tablet 3   omeprazole (PRILOSEC) 40 MG capsule TAKE 1 Capsule BY MOUTH ONCE DAILY 30 capsule 3   OVER THE COUNTER MEDICATION Occuvite-daily     pravastatin (PRAVACHOL) 20 MG tablet TAKE 1 Tablet BY MOUTH ONCE DAILY at bedtime 90 tablet PRN   Probiotic Product (PROBIOTIC PO) Take by mouth.     sildenafil (REVATIO) 20 MG tablet Take 1-5 tablets PRN 90 tablet 2   VITAMIN D PO Take 250 mg by mouth daily.     No current  facility-administered medications for this visit.    PHYSICAL EXAM: Vitals:   05/02/23 1051  BP: (!) 138/94  Pulse: (!) 103  Resp: 16  Temp: 97.7 F (36.5 C)    Awake alert and oriented Nonlabored respirations Left lower extremity varicosities marked with the patient standing and small saphenous vein evaluated with the patient in prone position   PROCEDURE: Left small saphenous vein ablation totaling 15 cm and stab phlebectomy of left leg between 10 and 20  TECHNIQUE: Mr. Codrington was initially evaluated in the standing position in his varicosities were marked.  He was then placed prone on the table and the small saphenous vein was evaluated and he was sterilely prepped in the left lower extremity and a timeout was called.  We began with ultrasound evaluation of the small saphenous vein in the area was anesthetized with 1% lidocaine and was cannulated with a micropuncture needle followed by wire and a sheath.  A Rosen wire was placed just proximal to the sapheno-popliteal junction and a 45 cm catheter was placed to 15cm.  The vein was then ablated for a total of 15cm.  At completion the popliteal vein was compressible.  Attention was then turned to the varicosities.  Between 10 and 20 stab incisions were created in the posterior and anterior lateral leg and the veins were snared with crochet hook and removed with hemostat and removed entirely.  A sterile compressive  wrap was then placed.  He tolerated the procedure well without immediate complication.  Plan will be for follow-up in 2 weeks with post ablation duplex to rule out DVT.  Lemar Livings [facility] [phone]

## 2023-05-02 NOTE — Progress Notes (Signed)
     Laser Ablation Procedure    Date: 05/02/2023   Raymond Mann  DOB:November 22, 1955  Consent signed: Yes      Surgeon: Lemar Livings MD   Procedure: Laser Ablation: left Small Saphenous Vein  BP (!) 138/94 (BP Location: Right Arm, Patient Position: Sitting, Cuff Size: Normal)   Pulse (!) 103   Temp 97.7 F (36.5 C) (Temporal)   Resp 16   Ht 5' 11.5" (1.816 m)   Wt 239 lb (108.4 kg)   BMI 32.87 kg/m   Tumescent Anesthesia: 425 cc 0.9% NaCl with 50 cc Lidocaine HCL 1%  and 15 cc 8.4% NaHCO3  Local Anesthesia: 20 cc Lidocaine HCL and NaHCO3 (ratio 2:1)  7 watts continuous mode     Total energy: 754 Joules     Total time: 107 seconds Treatment Length  15 cm   Laser Fiber Ref. #   [id number]         Lot #  [id number]   Stab Phlebectomy: 10-20 incisions  Sites: Calf  Patient tolerated procedure well  Notes: All staff members wore facial masks.  Raymond Mann took Ativan 1 mg (2 tablets) on 05-02-2023 at 9:35 AM.      Description of Procedure:  After marking the course of the secondary varicosities, the patient was placed on the operating table in the prone position, and the left leg was prepped and draped in sterile fashion.   Local anesthetic was administered and under ultrasound guidance the saphenous vein was accessed with a micro needle and guide wire; then the mirco puncture sheath was placed.  A guide wire was inserted saphenopopliteal junction , followed by a 5 french sheath.  The position of the sheath and then the laser fiber below the junction was confirmed using the ultrasound.  Tumescent anesthesia was administered along the course of the saphenous vein using ultrasound guidance. The patient was placed in Trendelenburg position and protective laser glasses were placed on patient and staff, and the laser was fired at 7 watts continuous mode for a total of 754 joules.   For stab phlebectomies, local anesthetic was administered at the previously marked varicosities, and tumescent  anesthesia was administered around the vessels.  Ten to 20 stab wounds were made using the tip of an 11 blade. And using the vein hook, the phlebectomies were performed using a hemostat to avulse the varicosities.  Adequate hemostasis was achieved.     Steri strips were applied to the stab wounds and ABD pads and thigh high compression stockings were applied.  Ace wrap bandages were applied over the phlebectomy sites and at the top of the saphenopopliteal junction. Blood loss was less than 15 cc.  Discharge instructions reviewed with patient and hardcopy of discharge instructions given to patient to take home. The patient ambulated out of the operating room having tolerated the procedure well.

## 2023-05-08 ENCOUNTER — Ambulatory Visit (INDEPENDENT_AMBULATORY_CARE_PROVIDER_SITE_OTHER): Payer: PPO | Admitting: Adult Health

## 2023-05-08 ENCOUNTER — Encounter: Payer: Self-pay | Admitting: Adult Health

## 2023-05-08 VITALS — BP 110/70 | HR 90 | Temp 98.9°F | Ht 71.5 in | Wt 247.0 lb

## 2023-05-08 DIAGNOSIS — H6012 Cellulitis of left external ear: Secondary | ICD-10-CM | POA: Diagnosis not present

## 2023-05-08 MED ORDER — DOXYCYCLINE HYCLATE 100 MG PO CAPS
100.0000 mg | ORAL_CAPSULE | Freq: Two times a day (BID) | ORAL | 0 refills | Status: DC
Start: 2023-05-08 — End: 2023-05-23

## 2023-05-08 NOTE — Progress Notes (Signed)
Subjective:    Patient ID: Raymond Mann, male    DOB: December 13, 1955, 67 y.o.   MRN: [id number]  HPI  67 year old male who  has a past medical history of Cataract of right eye, ED (erectile dysfunction), Essential hypertension, Fatty liver, Hyperlipidemia, Myopia, Ocular disease, Pseudophakia of left eye, Retinal detachment, Shingles, and Sight deterioration.  He presents to the office today for left ear pain x 1 week.  Pain is present when pushing on the outside of the ear and sticking finger in ear canal.  He has not had any drainage from the ear, fevers, chills, or feeling acutely ill.  Wt Readings from Last 3 Encounters:  05/08/23 247 lb (112 kg)  05/02/23 239 lb (108.4 kg)  03/29/23 239 lb (108.4 kg)     Review of Systems See HPI   Past Medical History:  Diagnosis Date   Cataract of right eye    ED (erectile dysfunction)    Essential hypertension    Fatty liver    Hyperlipidemia    Myopia    Ocular disease    Pseudophakia of left eye    Retinal detachment    both eyes   Shingles    Sight deterioration    right eye    Social History   Socioeconomic History   Marital status: Married    Spouse name: Not on file   Number of children: 2   Years of education: Not on file   Highest education level: Bachelor's degree (e.g., BA, AB, BS)  Occupational History   Not on file  Tobacco Use   Smoking status: Never   Smokeless tobacco: Never  Substance and Sexual Activity   Alcohol use: Yes    Comment: occasionally   Drug use: No   Sexual activity: Not on file  Other Topics Concern   Not on file  Social History 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    One grandchild       Social Determinants of Health   Financial Resource Strain: Low Risk  (09/26/2022)   Overall Financial Resource Strain (CARDIA)    Difficulty of Paying Living Expenses: Not hard at all  Food Insecurity: No Food Insecurity (09/26/2022)   Hunger Vital Sign     Worried About Running Out of Food in the Last Year: Never true    Ran Out of Food in the Last Year: Never true  Transportation Needs: No Transportation Needs (09/26/2022)   PRAPARE - Administrator, Civil Service (Medical): No    Lack of Transportation (Non-Medical): No  Physical Activity: Inactive (09/26/2022)   Exercise Vital Sign    Days of Exercise per Week: 0 days    Minutes of Exercise per Session: 0 min  Stress: No Stress Concern Present (09/26/2022)   [employer] of Occupational Health - Occupational Stress Questionnaire    Feeling of Stress : Not at all  Social Connections: Socially Integrated (09/26/2022)   Social Connection and Isolation Panel [NHANES]    Frequency of Communication with Friends and Family: More than three times a week    Frequency of Social Gatherings with Friends and Family: More than three times a week    Attends Religious Services: More than 4 times per year    Active Member of [employer] or Organizations: Yes    Attends Banker Meetings: More than 4 times per year    Marital Status:  Married  Intimate Partner Violence: Not At Risk (09/26/2022)   Humiliation, Afraid, Rape, and Kick questionnaire    Fear of Current or Ex-Partner: No    Emotionally Abused: No    Physically Abused: No    Sexually Abused: No    Past Surgical History:  Procedure Laterality Date   bilateral retinal detachment     x2 each eye   CAPSULOTOMY  10/09/1999   left eye   cryopexy  10/08/1993   right eye   ENDOVENOUS ABLATION SAPHENOUS VEIN W/ LASER Left 05/02/2023   endovenous laser ablation left small saphenous vein and stab phlebectomy 10-20 incisions left leg by Lemar Livings MD   scleral buckling  10/08/1993   left eye   VITRECTOMY  10/08/1993   both eyes    Family History  Problem Relation Age of Onset   Stroke Father    Heart attack Father    Dementia Mother    Stomach cancer Mother 62   Retinal detachment Brother     Allergies   Allergen Reactions   Bee Venom Swelling   Lisinopril Cough   Norvasc [Amlodipine]     Leg swelling    Nutritional Supplements Other (See Comments)    Itching, possibly    Current Outpatient Medications on File Prior to Visit  Medication Sig Dispense Refill   albuterol (VENTOLIN HFA) 108 (90 Base) MCG/ACT inhaler Inhale 2 puffs into the lungs every 6 (six) hours as needed for wheezing or shortness of breath. 8 g 0   carboxymethylcellulose (REFRESH TEARS) 0.5 % SOLN Apply to eye.     EPINEPHrine (EPIPEN 2-PAK) 0.3 mg/0.3 mL IJ SOAJ injection as directed Injection 1 each 1   fluticasone-salmeterol (ADVAIR DISKUS) 100-50 MCG/ACT AEPB Inhale 1 puff into the lungs 2 (two) times daily. 3 each 3   latanoprost (XALATAN) 0.005 % ophthalmic solution Place 1 drop into the left eye nightly.     LORazepam (ATIVAN) 1 MG tablet Take 2 tablets 30 minutes prior to leaving house on day of office surgery.  Bring third tablet with you to office on day of office surgery. 3 tablet 0   olmesartan (BENICAR) 5 MG tablet Take 1 tablet (5 mg total) by mouth daily. 90 tablet 3   omeprazole (PRILOSEC) 40 MG capsule TAKE 1 Capsule BY MOUTH ONCE DAILY 30 capsule 3   OVER THE COUNTER MEDICATION Occuvite-daily     pravastatin (PRAVACHOL) 20 MG tablet TAKE 1 Tablet BY MOUTH ONCE DAILY at bedtime 90 tablet PRN   Probiotic Product (PROBIOTIC PO) Take by mouth.     sildenafil (REVATIO) 20 MG tablet Take 1-5 tablets PRN 90 tablet 2   VITAMIN D PO Take 250 mg by mouth daily.     No current facility-administered medications on file prior to visit.    BP 110/70   Pulse 90   Temp 98.9 F (37.2 C) (Oral)   Ht 5' 11.5" (1.816 m)   Wt 247 lb (112 kg)   SpO2 97%   BMI 33.97 kg/m       Objective:   Physical Exam Vitals and nursing note reviewed.  Constitutional:      Appearance: Normal appearance.  HENT:     Left Ear: Swelling and tenderness present.  No middle ear effusion. Tympanic membrane is not erythematous or  bulging.     Ears:      Comments: Swelling and tenderness noted. No mastoid tenderness Cardiovascular:     Rate and Rhythm: Normal rate and regular rhythm.  Neurological:     General: No focal deficit present.     Mental Status: He is alert and oriented to person, place, and time.  Psychiatric:        Mood and Affect: Mood normal.        Behavior: Behavior normal.        Thought Content: Thought content normal.        Judgment: Judgment normal.        Assessment & Plan:  1. Cellulitis of left ear  - doxycycline (VIBRAMYCIN) 100 MG capsule; Take 1 capsule (100 mg total) by mouth 2 (two) times daily.  Dispense: 14 capsule; Refill: 0 - Follow up if not resolves in the next week or sooner if symptoms worsen   [name], NP

## 2023-05-16 ENCOUNTER — Encounter (HOSPITAL_COMMUNITY): Payer: PPO

## 2023-05-16 ENCOUNTER — Encounter: Payer: PPO | Admitting: Vascular Surgery

## 2023-05-23 ENCOUNTER — Ambulatory Visit (HOSPITAL_COMMUNITY)
Admission: RE | Admit: 2023-05-23 | Discharge: 2023-05-23 | Disposition: A | Discharge status: Home / Self Care | Payer: PPO | Source: Ambulatory Visit | Attending: Vascular Surgery | Admitting: Vascular Surgery

## 2023-05-23 ENCOUNTER — Ambulatory Visit (INDEPENDENT_AMBULATORY_CARE_PROVIDER_SITE_OTHER): Payer: PPO | Admitting: Vascular Surgery

## 2023-05-23 ENCOUNTER — Encounter: Payer: Self-pay | Admitting: Vascular Surgery

## 2023-05-23 VITALS — BP 130/88 | HR 93 | Temp 98.2°F | Ht 71.0 in | Wt 245.0 lb

## 2023-05-23 DIAGNOSIS — I83812 Varicose veins of left lower extremities with pain: Secondary | ICD-10-CM | POA: Insufficient documentation

## 2023-05-23 NOTE — Progress Notes (Signed)
     Subjective:     Patient ID: Raymond Mann, male   DOB: 1956/08/04, 67 y.o.   MRN: [id number]  HPI 67 year old male has undergone left small saphenous vein ablation and stab phlebectomy between 10 and 20 for C3 venous disease.  States that he covering well without any significant pain and continues to wear his compression sock.   Review of Systems No complaints    Objective:   Physical Exam Vitals:   05/23/23 1057  BP: 130/88  Pulse: 93  Temp: 98.2 F (36.8 C)  SpO2: 94%     LEFT     Reflux NoRefluxReflux TimeDiameter cmsComments                     Yes                                   +---------+---------+------+-----------+------------+--------+  CFV                                           Patent    +---------+---------+------+-----------+------------+--------+  FV mid                                         Patent    +---------+---------+------+-----------+------------+--------+  Popliteal                                     Patent    +---------+---------+------+-----------+------------+--------+         Summary:  Left:  - No evidence of deep vein thrombosis from the common femoral through the  popliteal veins.  - Successful ablation of the small saphenous vein from the mid/distal calf  up to the saphenopopliteal junction.     Assessment:     67 year old male recovering well after left small saphenous vein ablation and stab lipectomy between 10 and 20 no evidence of DVT on ultrasound today.    Plan:     Continue compression stocking  Follow-up as needed  Kaelah Hayashi C. Randie Heinz, MD [facility] Office: [phone] Pager: [phone]

## 2023-05-26 DIAGNOSIS — G4733 Obstructive sleep apnea (adult) (pediatric): Secondary | ICD-10-CM | POA: Diagnosis not present

## 2023-05-28 ENCOUNTER — Telehealth: Payer: Self-pay | Admitting: *Deleted

## 2023-05-28 NOTE — Telephone Encounter (Signed)
ATC patient, left detailed message regarding CPAP machine to see if he has one he is wearing and if so if he has an SD card (memory card) in it he can bring to his appointment tomorrow.

## 2023-05-29 ENCOUNTER — Encounter: Payer: Self-pay | Admitting: Adult Health

## 2023-05-29 ENCOUNTER — Ambulatory Visit: Payer: PPO | Admitting: Adult Health

## 2023-05-29 VITALS — BP 128/70 | HR 92 | Temp 98.4°F | Ht 71.5 in | Wt 240.4 lb

## 2023-05-29 DIAGNOSIS — J4530 Mild persistent asthma, uncomplicated: Secondary | ICD-10-CM

## 2023-05-29 DIAGNOSIS — R053 Chronic cough: Secondary | ICD-10-CM | POA: Diagnosis not present

## 2023-05-29 DIAGNOSIS — G4733 Obstructive sleep apnea (adult) (pediatric): Secondary | ICD-10-CM | POA: Diagnosis not present

## 2023-05-29 DIAGNOSIS — J45909 Unspecified asthma, uncomplicated: Secondary | ICD-10-CM | POA: Insufficient documentation

## 2023-05-29 LAB — NITRIC OXIDE: Nitric Oxide: 21

## 2023-05-29 MED ORDER — BENZONATATE 200 MG PO CAPS: 200.0000 mg | ORAL_CAPSULE | Freq: Three times a day (TID) | ORAL | 3 refills | Status: AC | PRN

## 2023-05-29 NOTE — Patient Instructions (Addendum)
Advair 1 puff Twice daily, rinse after use.  Allegra 180mg  daily in am as needed.  Chlorpheniramine 4mg  (Chlor tabs) 1 At bedtime As needed drainage or increased cough .   Continue on Prilosec 20mg  daily  Pepcid 20mg  At bedtime  As needed  for heartburn or increased cough.  Delsym 2 tsp Twice daily  for cough , As needed   Tessalon Three times a day  for cough As needed   Saline nasal spray Twice daily   Albuterol inhaler As needed    Continue on CPAP At bedtime   Keep up good work.  Work on healthy weight  Do not drive if sleepy   Follow up with in 6 months with Dr. Wynona Neat (30 min slot) Or Nisaiah Bechtol NP and As needed   Please contact office for sooner follow up if symptoms do not improve or worsen or seek emergency care

## 2023-05-29 NOTE — Assessment & Plan Note (Signed)
Excellent control and compliance on nocturnal CPAP.  Continue on current settings  Plan  Patient Instructions  Advair 1 puff Twice daily, rinse after use.  Allegra 180mg  daily in am as needed.  Chlorpheniramine 4mg  (Chlor tabs) 1 At bedtime As needed drainage or increased cough .   Continue on Prilosec 20mg  daily  Pepcid 20mg  At bedtime  As needed  for heartburn or increased cough.  Delsym 2 tsp Twice daily  for cough , As needed   Tessalon Three times a day  for cough As needed   Saline nasal spray Twice daily   Albuterol inhaler As needed    Continue on CPAP At bedtime   Keep up good work.  Work on healthy weight  Do not drive if sleepy   Follow up with in 6 months with Dr. Wynona Neat (30 min slot) Or Yochanan Eddleman NP and As needed   Please contact office for sooner follow up if symptoms do not improve or worsen or seek emergency care

## 2023-05-29 NOTE — Assessment & Plan Note (Signed)
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  Patient is improved on added care and maintenance regimen aimed at controlling chronic rhinitis and GERD   Plan  Patient Instructions  Advair 1 puff Twice daily, rinse after use.  Allegra 180mg  daily in am as needed.  Chlorpheniramine 4mg  (Chlor tabs) 1 At bedtime As needed drainage or increased cough .   Continue on Prilosec 20mg  daily  Pepcid 20mg  At bedtime  As needed  for heartburn or increased cough.  Delsym 2 tsp Twice daily  for cough , As needed   Tessalon Three times a day  for cough As needed   Saline nasal spray Twice daily   Albuterol inhaler As needed    Continue on CPAP At bedtime   Keep up good work.  Work on healthy weight  Do not drive if sleepy   Follow up with in 6 months with Dr. Wynona Neat (30 min slot) Or Syla Devoss NP and As needed   Please contact office for sooner follow up if symptoms do not improve or worsen or seek emergency care

## 2023-05-29 NOTE — Progress Notes (Signed)
@Patient  ID: Raymond Mann, male    DOB: 03/07/1956, 67 y.o.   MRN: [id number]  Chief Complaint  Patient presents with   Follow-up    Referring provider: [name], NP  HPI: 67 year old male never smoker seen for consult November 23, 2022 for snoring found to have very severe sleep apnea and a chronic cough consistent with moderate persistent asthma  TEST/EVENTS :  home sleep study December 06, 2022 that showed severe sleep apnea with AHI at 68/hour and SpO2 low at 73%.     01/29/23 PFTs moderate airflow obstruction with an FEV1 at 64%, ratio at 52, FVC 92%, DLCO 65%.   Chest xray 11/23/22 Clear   05/29/2023 Follow up ; OSA and Asthma  Patient returns for 76-month follow-up.  Patient was recently diagnosed with severe obstructive sleep apnea.  Started on CPAP.  Patient says he is doing well on CPAP.  Does feel that he is benefiting from CPAP with decreased daytime sleepiness feels like his energy level is improved.  He is currently using nasal pillows.  Usually gets in 9 to 10 hours a night.  CPAP download shows excellent compliance with 100% usage, average daily usage at 10 hours.  AHI 3.6/hour.  Patient was initially seen with recurrent bronchitis and chronic cough.  Pulmonary function testing showed moderate airflow obstruction.  Patient was started on Advair twice daily.  Recommended to use Allegra, chlor tabs, Prilosec and Pepcid.  Patient says he is doing better cough is improved substantially.  Breathing feels to be improved.  No flare of cough or wheezing.  He is currently using Allegra and chlor tabs only as needed.  Remains on Prilosec daily.  Is not using Pepcid on a routine basis.  No increased albuterol use.  Exhaled nitric oxide testing today is normal 21 ppb   Allergies  Allergen Reactions   Bee Venom Swelling   Lisinopril Cough   Norvasc [Amlodipine]     Leg swelling    Nutritional Supplements Other (See Comments)    Itching, possibly    Immunization History   Administered Date(s) Administered   Fluad Quad(high Dose 65+) 07/28/2020, 07/20/2022   Influenza, Quadrivalent, Recombinant, Inj, Pf 07/26/2018   Influenza,inj,Quad PF,6+ Mos 08/10/2017   Influenza-Unspecified 07/08/2021   PFIZER(Purple Top)SARS-COV-2 Vaccination 10/28/2019, 11/18/2019, 07/08/2020   PPD Test 04/03/2019   Pneumococcal Conjugate-13 07/23/2019   Pneumococcal Polysaccharide-23 07/28/2020   Tdap 04/02/2014   Zoster Recombinant(Shingrix) 06/01/2019, 08/29/2019   Zoster, Live 09/25/2017    Past Medical History:  Diagnosis Date   Cataract of right eye    ED (erectile dysfunction)    Essential hypertension    Fatty liver    Hyperlipidemia    Myopia    Ocular disease    Pseudophakia of left eye    Retinal detachment    both eyes   Shingles    Sight deterioration    right eye    Tobacco History: Social History   Tobacco Use  Smoking Status Never  Smokeless Tobacco Never   Counseling given: Not Answered   Outpatient Medications Prior to Visit  Medication Sig Dispense Refill   carboxymethylcellulose (REFRESH TEARS) 0.5 % SOLN Apply to eye.     EPINEPHrine (EPIPEN 2-PAK) 0.3 mg/0.3 mL IJ SOAJ injection as directed Injection 1 each 1   fluticasone-salmeterol (ADVAIR DISKUS) 100-50 MCG/ACT AEPB Inhale 1 puff into the lungs 2 (two) times daily. 3 each 3   latanoprost (XALATAN) 0.005 % ophthalmic solution Place 1 drop into the left  eye nightly.     olmesartan (BENICAR) 5 MG tablet Take 1 tablet (5 mg total) by mouth daily. 90 tablet 3   omeprazole (PRILOSEC) 40 MG capsule TAKE 1 Capsule BY MOUTH ONCE DAILY 30 capsule 3   OVER THE COUNTER MEDICATION Occuvite-daily     pravastatin (PRAVACHOL) 20 MG tablet TAKE 1 Tablet BY MOUTH ONCE DAILY at bedtime 90 tablet PRN   Probiotic Product (PROBIOTIC PO) Take by mouth.     sildenafil (REVATIO) 20 MG tablet Take 1-5 tablets PRN 90 tablet 2   VITAMIN D PO Take 250 mg by mouth daily.     albuterol (VENTOLIN HFA) 108 (90  Base) MCG/ACT inhaler Inhale 2 puffs into the lungs every 6 (six) hours as needed for wheezing or shortness of breath. (Patient not taking: Reported on 05/29/2023) 8 g 0   No facility-administered medications prior to visit.     Review of Systems:   Constitutional:   No  weight loss, night sweats,  Fevers, chills, fatigue, or  lassitude.  HEENT:   No headaches,  Difficulty swallowing,  Tooth/dental problems, or  Sore throat,                No sneezing, itching, ear ache,  +nasal congestion, post nasal drip,   CV:  No chest pain,  Orthopnea, PND, swelling in lower extremities, anasarca, dizziness, palpitations, syncope.   GI  No heartburn, indigestion, abdominal pain, nausea, vomiting, diarrhea, change in bowel habits, loss of appetite, bloody stools.   Resp: No shortness of breath with exertion or at rest.  No excess mucus, no productive cough,  No non-productive cough,  No coughing up of blood.  No change in color of mucus.  No wheezing.  No chest wall deformity  Skin: no rash or lesions.  GU: no dysuria, change in color of urine, no urgency or frequency.  No flank pain, no hematuria   MS:  No joint pain or swelling.  No decreased range of motion.  No back pain.    Physical Exam  BP 128/70 (BP Location: Left Arm, Cuff Size: Normal)   Pulse 92   Temp 98.4 F (36.9 C) (Temporal)   Ht 5' 11.5" (1.816 m)   Wt 240 lb 6.4 oz (109 kg)   SpO2 96%   BMI 33.06 kg/m   GEN: A/Ox3; pleasant , NAD, well nourished    HEENT:  [place]/AT, Throat clear  no postnasal drip or exudate noted.   NECK:  Supple w/ fair ROM; no JVD; normal carotid impulses w/o bruits; no thyromegaly or nodules palpated; no lymphadenopathy.    RESP  Clear  P & A; w/o, wheezes/ rales/ or rhonchi. no accessory muscle use, no dullness to percussion  CARD:  RRR, no m/r/g, no peripheral edema, pulses intact, no cyanosis or clubbing.  GI:   Soft & nt; nml bowel sounds; no organomegaly or masses detected.   Musco: Warm  bil, no deformities or joint swelling noted.   Neuro: alert, no focal deficits noted.    Skin: Warm, no lesions or rashes    Lab Results:    BNP    Component Value Date/Time   BNP 20.5 09/01/2018 1107    ProBNP No results found for: "PROBNP"  Imaging:  Administration History     None          Latest Ref Rng & Units 01/29/2023    8:32 AM  PFT Results  FVC-Pre L 4.54   FVC-Predicted Pre % 92  Pre FEV1/FVC % % 52   FEV1-Pre L 2.37   FEV1-Predicted Pre % 64   DLCO uncorrected ml/min/mmHg 18.64   DLCO UNC% % 65   DLCO corrected ml/min/mmHg 18.64   DLCO COR %Predicted % 65   DLVA Predicted % 115   TLC L 7.68   TLC % Predicted % 102   RV % Predicted % 35     Lab Results  Component Value Date   NITRICOXIDE 21 05/29/2023        Assessment & Plan:   OSA (obstructive sleep apnea) Excellent control and compliance on nocturnal CPAP.  Continue on current settings  Plan  Patient Instructions  Advair 1 puff Twice daily, rinse after use.  Allegra 180mg  daily in am as needed.  Chlorpheniramine 4mg  (Chlor tabs) 1 At bedtime As needed drainage or increased cough .   Continue on Prilosec 20mg  daily  Pepcid 20mg  At bedtime  As needed  for heartburn or increased cough.  Delsym 2 tsp Twice daily  for cough , As needed   Tessalon Three times a day  for cough As needed   Saline nasal spray Twice daily   Albuterol inhaler As needed    Continue on CPAP At bedtime   Keep up good work.  Work on healthy weight  Do not drive if sleepy   Follow up with in 6 months with Dr. Wynona Neat (30 min slot) Or Savon Bordonaro NP and As needed   Please contact office for sooner follow up if symptoms do not improve or worsen or seek emergency care     Asthma Mild to moderate persistent asthma with upper airway cough.  Patient is improved on added care and maintenance regimen aimed at controlling chronic rhinitis and GERD   Plan  Patient Instructions  Advair 1 puff Twice daily, rinse  after use.  Allegra 180mg  daily in am as needed.  Chlorpheniramine 4mg  (Chlor tabs) 1 At bedtime As needed drainage or increased cough .   Continue on Prilosec 20mg  daily  Pepcid 20mg  At bedtime  As needed  for heartburn or increased cough.  Delsym 2 tsp Twice daily  for cough , As needed   Tessalon Three times a day  for cough As needed   Saline nasal spray Twice daily   Albuterol inhaler As needed    Continue on CPAP At bedtime   Keep up good work.  Work on healthy weight  Do not drive if sleepy   Follow up with in 6 months with Dr. Wynona Neat (30 min slot) Or Artavius Stearns NP and As needed   Please contact office for sooner follow up if symptoms do not improve or worsen or seek emergency care       Rubye Oaks, NP 05/29/2023

## 2023-06-13 DIAGNOSIS — H0279 Other degenerative disorders of eyelid and periocular area: Secondary | ICD-10-CM | POA: Diagnosis not present

## 2023-06-13 DIAGNOSIS — Z01818 Encounter for other preprocedural examination: Secondary | ICD-10-CM | POA: Diagnosis not present

## 2023-06-13 DIAGNOSIS — D485 Neoplasm of uncertain behavior of skin: Secondary | ICD-10-CM | POA: Diagnosis not present

## 2023-06-26 DIAGNOSIS — G4733 Obstructive sleep apnea (adult) (pediatric): Secondary | ICD-10-CM | POA: Diagnosis not present

## 2023-06-27 ENCOUNTER — Telehealth: Payer: Self-pay | Admitting: Adult Health

## 2023-06-27 DIAGNOSIS — E782 Mixed hyperlipidemia: Secondary | ICD-10-CM

## 2023-06-27 DIAGNOSIS — K2100 Gastro-esophageal reflux disease with esophagitis, without bleeding: Secondary | ICD-10-CM

## 2023-06-27 MED ORDER — OMEPRAZOLE 40 MG PO CPDR: 40.0000 mg | DELAYED_RELEASE_CAPSULE | Freq: Every day | ORAL | 3 refills | Status: DC

## 2023-06-27 MED ORDER — PRAVASTATIN SODIUM 20 MG PO TABS
20.0000 mg | ORAL_TABLET | Freq: Every day | ORAL | 99 refills | Status: DC
Start: 2023-06-27 — End: 2023-12-03

## 2023-06-27 NOTE — Telephone Encounter (Signed)
Prescription sent to pharmacy.

## 2023-06-27 NOTE — Telephone Encounter (Signed)
Pt has new pharm and would like pravastatin (PRAVACHOL) 20 MG tablet  and omeprazole (PRILOSEC) 40 MG capsule  [facility] [address], [place] - [address] Phone: [phone]  Fax: [phone]

## 2023-07-01 ENCOUNTER — Ambulatory Visit (INDEPENDENT_AMBULATORY_CARE_PROVIDER_SITE_OTHER): Payer: PPO

## 2023-07-01 DIAGNOSIS — Z23 Encounter for immunization: Secondary | ICD-10-CM

## 2023-07-02 NOTE — Progress Notes (Signed)
07/02/2023  Patient ID: Raymond Mann, male   DOB: 11-May-1956, 67 y.o.   MRN: [id number]  Pharmacy Quality Measure Review  This patient is appearing on a report for being at risk of failing the adherence measure for cholesterol (statin) medications this calendar year.   Medication: Pravastatin 20mg  Last fill date: 03/18/23 for 90 day supply per report  Insurance report was not up to date. No action needed at this time.  LF 06/27/23 90DS  Sherrill Raring, PharmD Clinical Pharmacist [phone]

## 2023-07-16 DIAGNOSIS — G43109 Migraine with aura, not intractable, without status migrainosus: Secondary | ICD-10-CM | POA: Diagnosis not present

## 2023-07-16 DIAGNOSIS — Z961 Presence of intraocular lens: Secondary | ICD-10-CM | POA: Diagnosis not present

## 2023-07-16 DIAGNOSIS — H52223 Regular astigmatism, bilateral: Secondary | ICD-10-CM | POA: Diagnosis not present

## 2023-07-16 DIAGNOSIS — H40003 Preglaucoma, unspecified, bilateral: Secondary | ICD-10-CM | POA: Diagnosis not present

## 2023-07-16 DIAGNOSIS — T8522XD Displacement of intraocular lens, subsequent encounter: Secondary | ICD-10-CM | POA: Diagnosis not present

## 2023-07-16 DIAGNOSIS — H33023 Retinal detachment with multiple breaks, bilateral: Secondary | ICD-10-CM | POA: Diagnosis not present

## 2023-07-19 DIAGNOSIS — G4733 Obstructive sleep apnea (adult) (pediatric): Secondary | ICD-10-CM | POA: Diagnosis not present

## 2023-07-26 DIAGNOSIS — G4733 Obstructive sleep apnea (adult) (pediatric): Secondary | ICD-10-CM | POA: Diagnosis not present

## 2023-08-14 DIAGNOSIS — H40053 Ocular hypertension, bilateral: Secondary | ICD-10-CM | POA: Diagnosis not present

## 2023-08-19 DIAGNOSIS — X32XXXD Exposure to sunlight, subsequent encounter: Secondary | ICD-10-CM | POA: Diagnosis not present

## 2023-08-19 DIAGNOSIS — L308 Other specified dermatitis: Secondary | ICD-10-CM | POA: Diagnosis not present

## 2023-08-19 DIAGNOSIS — Z1283 Encounter for screening for malignant neoplasm of skin: Secondary | ICD-10-CM | POA: Diagnosis not present

## 2023-08-19 DIAGNOSIS — D225 Melanocytic nevi of trunk: Secondary | ICD-10-CM | POA: Diagnosis not present

## 2023-08-19 DIAGNOSIS — L570 Actinic keratosis: Secondary | ICD-10-CM | POA: Diagnosis not present

## 2023-08-26 DIAGNOSIS — G4733 Obstructive sleep apnea (adult) (pediatric): Secondary | ICD-10-CM | POA: Diagnosis not present

## 2023-09-20 ENCOUNTER — Telehealth: Payer: Self-pay

## 2023-09-20 NOTE — Progress Notes (Signed)
   09/20/2023  Patient ID: Raymond Mann, male   DOB: April 20, 1956, 67 y.o.   MRN: [id number]  Pharmacy Quality Measure Review  This patient is appearing on a report for being at risk of failing the adherence measure for cholesterol (statin) medications this calendar year.   Medication: Pravastatin 20mg  Last fill date: 07/06/33 for 90 day supply  Contacted pharmacy to facilitate refills. With patient consent.  Patient also requested refill on albuterol inhaler, message sent to PCP for approval.  Sherrill Raring, PharmD Clinical Pharmacist [phone].

## 2023-09-24 ENCOUNTER — Telehealth: Payer: Self-pay

## 2023-09-24 ENCOUNTER — Other Ambulatory Visit: Payer: Self-pay

## 2023-09-24 DIAGNOSIS — J988 Other specified respiratory disorders: Secondary | ICD-10-CM

## 2023-09-24 DIAGNOSIS — R051 Acute cough: Secondary | ICD-10-CM

## 2023-09-24 MED ORDER — ALBUTEROL SULFATE HFA 108 (90 BASE) MCG/ACT IN AERS: 2.0000 | INHALATION_SPRAY | Freq: Four times a day (QID) | RESPIRATORY_TRACT | 0 refills | Status: DC | PRN

## 2023-09-24 NOTE — Telephone Encounter (Signed)
Rx refilled.

## 2023-09-24 NOTE — Telephone Encounter (Signed)
-----   Message from Sherrill Raring sent at 09/20/2023  2:40 PM EST ----- Regarding: Refill Patient requesting a new refill on the following medication:  Ventolin HFA Inhale 2 puffs into the lungs every 6 (six) hours as needed for wheezing or shortness of breath.   Preferred Pharmacy: [facility] [address], [place] - [address] (Ph: [phone])  Thank you, Sherrill Raring, PharmD Clinical Pharmacist [phone]

## 2023-09-25 DIAGNOSIS — G4733 Obstructive sleep apnea (adult) (pediatric): Secondary | ICD-10-CM | POA: Diagnosis not present

## 2023-09-26 DIAGNOSIS — L720 Epidermal cyst: Secondary | ICD-10-CM | POA: Diagnosis not present

## 2023-09-26 DIAGNOSIS — D23122 Other benign neoplasm of skin of left lower eyelid, including canthus: Secondary | ICD-10-CM | POA: Diagnosis not present

## 2023-09-26 DIAGNOSIS — D485 Neoplasm of uncertain behavior of skin: Secondary | ICD-10-CM | POA: Diagnosis not present

## 2023-09-26 DIAGNOSIS — D23112 Other benign neoplasm of skin of right lower eyelid, including canthus: Secondary | ICD-10-CM | POA: Diagnosis not present

## 2023-09-29 ENCOUNTER — Encounter: Payer: Self-pay | Admitting: Pharmacist

## 2023-09-29 NOTE — Progress Notes (Signed)
Pharmacy Quality Measure Review  This patient is appearing on a report for being at risk of failing the adherence measure for cholesterol (statin) medications this calendar year.   Medication: pravastatin 20 mg Last fill date: 12/13 for 90 day supply  Insurance report was not up to date. No action needed at this time.   Jarrett Ables, PharmD PGY-1 Pharmacy Resident

## 2023-10-01 ENCOUNTER — Encounter: Payer: Self-pay | Admitting: Family Medicine

## 2023-10-01 ENCOUNTER — Ambulatory Visit: Payer: PPO | Admitting: Family Medicine

## 2023-10-01 DIAGNOSIS — Z0000 Encounter for general adult medical examination without abnormal findings: Secondary | ICD-10-CM

## 2023-10-01 NOTE — Progress Notes (Signed)
"  Patient was unable to self-report due to a lack of equipment at home via telehealth"

## 2023-10-01 NOTE — Progress Notes (Signed)
PATIENT CHECK-IN and HEALTH RISK ASSESSMENT QUESTIONNAIRE:  -completed by phone/video for upcoming Medicare Preventive Visit  Pre-Visit Check-in: 1)Vitals (height, wt, BP, etc) - record in vitals section for visit on day of visit Request home vitals (wt, BP, etc.) and enter into vitals, THEN update Vital Signs SmartPhrase below at the top of the HPI. See below.  2)Review and Update Medications, Allergies PMH, Surgeries, Social history in Epic 3)Hospitalizations in the last year with date/reason? NO   4)Review and Update Care Team (patient's specialists) in Epic 5) Complete PHQ9 in Epic  6) Complete Fall Screening in Epic 7)Review all Health Maintenance Due and order under PCP if not done.  Medicare Wellness Patient Questionnaire:  Answer theses question about your habits: How often do you have a drink containing alcohol?Yes, How many drinks containing alcohol do you have on a typical day when you are drinking?1 How often do you have six or more drinks on one occasion?NO  Have you ever smoked?NO  Quit date if applicable? NA   How many packs a day do/did you smoke? NA  Do you use smokeless tobacco?NA  Do you use an illicit drugs?NO  On average, how many days per week do you engage in moderate to strenuous exercise (like a brisk walk)?6 days a week  On average, how many minutes do you engage in exercise at this level?30 minutes walking, 30 minutes in a class 3 days per week - exercise class strength training Are you sexually active? Yes Number of partners?one  Typical breakfast: protein shake, eggs, toast Typical lunch: sandwich  Typical dinner: home chef  Typical snacks:popcorn, fruit   Beverages: water, juice,   Answer theses question about your everyday activities: Can you perform most household chores?Yes  Are you deaf or have significant trouble hearing?no  Do you feel that you have a problem with memory? No  Do you feel safe at home?yes  Last dentist visit?Oct 2024 8. Do you  have any difficulty performing your everyday activities?NO  Are you having any difficulty walking, taking medications on your own, and or difficulty managing daily home needs?NO  Do you have difficulty walking or climbing stairs?NO  Do you have difficulty dressing or bathing?NO  Do you have difficulty doing errands alone such as visiting a doctor's office or shopping?NO  Do you currently have any difficulty preparing food and eating?NO  Do you currently have any difficulty using the toilet?NO  Do you have any difficulty managing your finances?NO  Do you have any difficulties with housekeeping of managing your housekeeping?NO    Do you have Advanced Directives in place (Living Will, Healthcare Power or Attorney)? NO    Last eye Exam and location?Sept 2024, Dr Stacey Drain    Do you currently use prescribed or non-prescribed narcotic or opioid pain medications?NO   Do you have a history or close family history of breast, ovarian, tubal or peritoneal cancer or a family member with BRCA (breast cancer susceptibility 1 and 2) gene mutations?NO   Request home vitals (wt, BP, etc.) and enter into vitals, THEN update Vital Signs SmartPhrase below at the top of the HPI. See below.   Nurse/Assistant Credentials/time stamp:Leah ADelford Field CMA 11:17am    ----------------------------------------------------------------------------------------------------------------------------------------------------------------------------------------------------------------------  Because this visit was a virtual/telehealth visit, some criteria may be missing or patient reported. Any vitals not documented were not able to be obtained and vitals that have been documented are patient reported.    MEDICARE ANNUAL PREVENTIVE CARE VISIT WITH PROVIDER (Welcome to Medicare, initial annual  wellness or annual wellness exam)  Virtual Visit via Video Note  I connected with Raymond Mann on 10/01/23  by a video enabled  telemedicine application and verified that I am speaking with the correct person using two identifiers.  Location patient: home Location provider:work or home office Persons participating in the virtual visit: patient, provider  Concerns and/or follow up today: no concerns   See HM section in Epic for other details of completed HM.    ROS: negative for report of fevers, unintentional weight loss, vision changes, vision loss, hearing loss or change, chest pain, sob, hemoptysis, melena, hematochezia, hematuria, falls, bleeding or bruising, thoughts of suicide or self harm, memory loss  Patient-completed extensive health risk assessment - reviewed and discussed with the patient: See Health Risk Assessment completed with patient prior to the visit either above or in recent phone note. This was reviewed in detailed with the patient today and appropriate recommendations, orders and referrals were placed as needed per Summary below and patient instructions.   Review of Medical History: -PMH, PSH, Family History and current specialty and care providers reviewed and updated and listed below   Patient Care Team: [name], NP as PCP - General (Family Medicine)   Past Medical History:  Diagnosis Date   Cataract of right eye    ED (erectile dysfunction)    Essential hypertension    Fatty liver    Hyperlipidemia    Myopia    Ocular disease    Pseudophakia of left eye    Retinal detachment    both eyes   Shingles    Sight deterioration    right eye    Past Surgical History:  Procedure Laterality Date   bilateral retinal detachment     x2 each eye   CAPSULOTOMY  10/09/1999   left eye   cryopexy  10/08/1993   right eye   ENDOVENOUS ABLATION SAPHENOUS VEIN W/ LASER Left 05/02/2023   endovenous laser ablation left small saphenous vein and stab phlebectomy 10-20 incisions left leg by Lemar Livings MD   scleral buckling  10/08/1993   left eye   VITRECTOMY  10/08/1993   both eyes     Social History   Socioeconomic History   Marital status: Married    Spouse name: Not on file   Number of children: 2   Years of education: Not on file   Highest education level: Bachelor's degree (e.g., BA, AB, BS)  Occupational History   Not on file  Tobacco Use   Smoking status: Never   Smokeless tobacco: Never  Substance and Sexual Activity   Alcohol use: Yes    Comment: occasionally   Drug use: No   Sexual activity: Not on file  Other Topics Concern   Not on file  Social History 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    One grandchild       Social Drivers of Health   Financial Resource Strain: Low Risk  (09/26/2022)   Overall Financial Resource Strain (CARDIA)    Difficulty of Paying Living Expenses: Not hard at all  Food Insecurity: No Food Insecurity (09/26/2022)   Hunger Vital Sign    Worried About Running Out of Food in the Last Year: Never true    Ran Out of Food in the Last Year: Never true  Transportation Needs: No Transportation Needs (09/26/2022)   PRAPARE - Administrator, Civil Service (Medical):  No    Lack of Transportation (Non-Medical): No  Physical Activity: Inactive (09/26/2022)   Exercise Vital Sign    Days of Exercise per Week: 0 days    Minutes of Exercise per Session: 0 min  Stress: No Stress Concern Present (09/26/2022)   [employer] of Occupational Health - Occupational Stress Questionnaire    Feeling of Stress : Not at all  Social Connections: Socially Integrated (09/26/2022)   Social Connection and Isolation Panel [NHANES]    Frequency of Communication with Friends and Family: More than three times a week    Frequency of Social Gatherings with Friends and Family: More than three times a week    Attends Religious Services: More than 4 times per year    Active Member of [employer] or Organizations: Yes    Attends Engineer, structural: More than 4 times per year    Marital Status:  Married  Catering manager Violence: Not At Risk (09/26/2022)   Humiliation, Afraid, Rape, and Kick questionnaire    Fear of Current or Ex-Partner: No    Emotionally Abused: No    Physically Abused: No    Sexually Abused: No    Family History  Problem Relation Age of Onset   Stroke Father    Heart attack Father    Dementia Mother    Stomach cancer Mother 67   Retinal detachment Brother     Current Outpatient Medications on File Prior to Visit  Medication Sig Dispense Refill   albuterol (VENTOLIN HFA) 108 (90 Base) MCG/ACT inhaler Inhale 2 puffs into the lungs every 6 (six) hours as needed for wheezing or shortness of breath. 8 g 0   benzonatate (TESSALON) 200 MG capsule Take 1 capsule (200 mg total) by mouth 3 (three) times daily as needed. 45 capsule 3   carboxymethylcellulose (REFRESH TEARS) 0.5 % SOLN Apply to eye.     EPINEPHrine (EPIPEN 2-PAK) 0.3 mg/0.3 mL IJ SOAJ injection as directed Injection 1 each 1   fluticasone-salmeterol (ADVAIR DISKUS) 100-50 MCG/ACT AEPB Inhale 1 puff into the lungs 2 (two) times daily. 3 each 3   latanoprost (XALATAN) 0.005 % ophthalmic solution Place 1 drop into the left eye nightly.     olmesartan (BENICAR) 5 MG tablet Take 1 tablet (5 mg total) by mouth daily. 90 tablet 3   omeprazole (PRILOSEC) 40 MG capsule Take 1 capsule (40 mg total) by mouth daily. 30 capsule 3   OVER THE COUNTER MEDICATION Occuvite-daily     pravastatin (PRAVACHOL) 20 MG tablet Take 1 tablet (20 mg total) by mouth at bedtime. 90 tablet PRN   Probiotic Product (PROBIOTIC PO) Take by mouth.     sildenafil (REVATIO) 20 MG tablet Take 1-5 tablets PRN 90 tablet 2   VITAMIN D PO Take 250 mg by mouth daily.     No current facility-administered medications on file prior to visit.    Allergies  Allergen Reactions   Bee Venom Swelling   Lisinopril Cough   Norvasc [Amlodipine]     Leg swelling    Nutritional Supplements Other (See Comments)    Itching, possibly        Physical Exam Vitals requested from patient and listed below if patient had equipment and was able to obtain at home for this virtual visit: There were no vitals filed for this visit. Estimated body mass index is 33.06 kg/m as calculated from the following:   Height as of 05/29/23: 5' 11.5" (1.816 m).   Weight as of  05/29/23: 240 lb 6.4 oz (109 kg).  EKG (optional): deferred due to virtual visit  GENERAL: alert, oriented, no acute distress detected; full vision exam deferred due to pandemic and/or virtual encounter    HEENT: atraumatic, conjunttiva clear, no obvious abnormalities on inspection of external nose and ears  NECK: normal movements of the head and neck  LUNGS: on inspection no signs of respiratory distress, breathing rate appears normal, no obvious gross SOB, gasping or wheezing  CV: no obvious cyanosis  MS: moves all visible extremities without noticeable abnormality  PSYCH/NEURO: pleasant and cooperative, no obvious depression or anxiety, speech and thought processing grossly intact, Cognitive function grossly intact  Flowsheet Row Office Visit from 07/20/2022 in [facility] [place]  PHQ-9 Total Score 2           10/01/2023   11:07 AM 09/26/2022   10:44 AM 07/20/2022    7:52 AM 10/26/2021    7:12 AM 07/26/2021   10:42 AM  Depression screen PHQ 2/9  Decreased Interest 0 0 0 0 0  Down, Depressed, Hopeless 0 0 0 0 0  PHQ - 2 Score 0 0 0 0 0  Altered sleeping   0 0   Tired, decreased energy   1 0   Change in appetite   1 0   Feeling bad or failure about yourself    0 0   Trouble concentrating   0 0   Moving slowly or fidgety/restless   0 0   Suicidal thoughts   0 0   PHQ-9 Score   2 0   Difficult doing work/chores   Not difficult at all Not difficult at all        07/26/2021   10:42 AM 10/26/2021    7:12 AM 09/26/2022   10:46 AM 03/06/2023    1:38 PM 10/01/2023   11:06 AM  Fall Risk  Falls in the past year? 0 0 0 0 0   Was there an injury with Fall? 0 0 0 0 0  Fall Risk Category Calculator 0 0 0 0 0  Fall Risk Category (Retired) Low Low Low    (RETIRED) Patient Fall Risk Level Low fall risk Low fall risk Low fall risk    Patient at Risk for Falls Due to No Fall Risks  No Fall Risks No Fall Risks No Fall Risks  Fall risk Follow up Falls evaluation completed  Falls prevention discussed Falls evaluation completed Falls evaluation completed     SUMMARY AND PLAN:  Encounter for Medicare annual wellness exam    Discussed applicable health maintenance/preventive health measures and advised and referred or ordered per patient preferences: -declines the covid vaccine Health Maintenance  Topic Date Due   COVID-19 Vaccine (4 - 2024-25 season) 10/17/2023 (Originally 06/09/2023)   DTaP/Tdap/Td (2 - Td or Tdap) 04/02/2024   Medicare Annual Wellness (AWV)  09/30/2024   Colonoscopy  10/30/2032   Pneumonia Vaccine 56+ Years old  Completed   INFLUENZA VACCINE  Completed   Hepatitis C Screening  Completed   Zoster Vaccines- Shingrix  Completed   HPV VACCINES  Aged Out     Education and counseling on the following was provided based on the above review of health and a plan/checklist for the patient, along with additional information discussed, was provided for the patient in the patient instructions :  -Advised on importance of completing advanced directives, discussed options for completing and provided information in patient instructions as well -Provided counseling and plan  for increased risk of falling if applicable per above screening. Reviewed and demonstrated safe balance exercises that can be done at home to improve balance and discussed exercise guidelines for adults with include balance exercises at least 3 days per week.  -Advised and counseled on a healthy lifestyle - including the importance of a healthy diet, regular physical activity, social connections -Reviewed patient's current diet. Advised and  counseled on a whole foods based healthy diet. A summary of a healthy diet was provided in the Patient Instructions.  -reviewed patient's current physical activity level and discussed exercise guidelines for adults.  -Advise yearly dental visits at minimum and regular eye exams -Advised and counseled on alcohol safe limits, risks  Follow up: see patient instructions   Patient Instructions  I really enjoyed getting to talk with you today! I am available on Tuesdays and Thursdays for virtual visits if you have any questions or concerns, or if I can be of any further assistance.   CHECKLIST FROM ANNUAL WELLNESS VISIT:  -Follow up (please call to schedule if not scheduled after visit):   -yearly for annual wellness visit with primary care office  Here is a list of your preventive care/health maintenance measures and the plan for each if any are due:  PLAN For any measures below that may be due:   Health Maintenance  Topic Date Due   COVID-19 Vaccine (4 - 2024-25 season) 10/17/2023 (Originally 06/09/2023)   DTaP/Tdap/Td (2 - Td or Tdap) 04/02/2024   Medicare Annual Wellness (AWV)  09/30/2024   Colonoscopy  10/30/2032   Pneumonia Vaccine 33+ Years old  Completed   INFLUENZA VACCINE  Completed   Hepatitis C Screening  Completed   Zoster Vaccines- Shingrix  Completed   HPV VACCINES  Aged Out    -See a dentist at least yearly  -Get your eyes checked and then per your eye specialist's recommendations  -Other issues addressed today:   -I have included below further information regarding a healthy whole foods based diet, physical activity guidelines for adults, stress management and opportunities for social connections. I hope you find this information useful.    -----------------------------------------------------------------------------------------------------------------------------------------------------------------------------------------------------------------------------------------------------------  NUTRITION: -eat real food: lots of colorful vegetables (half the plate) and fruits -5-7 servings of vegetables and fruits per day (fresh or steamed is best), exp. 2 servings of vegetables with lunch and dinner and 2 servings of fruit per day. Berries and greens such as kale and collards are great choices.  -consume on a regular basis: whole grains (make sure first ingredient on label contains the word "whole"), fresh fruits, fish, nuts, seeds, healthy oils (such as olive oil, avocado oil, grape seed oil) -may eat small amounts of dairy and lean meat on occasion, but avoid processed meats such as ham, bacon, lunch meat, etc. -drink water -try to avoid fast food and pre-packaged foods, processed meat -most experts advise limiting sodium to < 2300mg  per day, should limit further is any chronic conditions such as high blood pressure, heart disease, diabetes, etc. The American Heart Association advised that < 1500mg  is is ideal -try to avoid foods that contain any ingredients with names you do not recognize  -try to avoid sugar/sweets (except for the natural sugar that occurs in fresh fruit) -try to avoid sweet drinks -try to avoid white rice, white bread, pasta (unless whole grain), white or yellow potatoes  EXERCISE GUIDELINES FOR ADULTS: -if you wish to increase your physical activity, do so gradually and with the approval of your doctor -STOP  and seek medical care immediately if you have any chest pain, chest discomfort or trouble breathing when starting or increasing exercise  -move and stretch your body, legs, feet and arms when sitting for long periods -Physical activity guidelines for optimal health in adults: -least 150 minutes per week of  aerobic exercise (can talk, but not sing) once approved by your doctor, 20-30 minutes of sustained activity or two 10 minute episodes of sustained activity every day.  -resistance training at least 2 days per week if approved by your doctor -balance exercises 3+ days per week:   Stand somewhere where you have something sturdy to hold onto if you lose balance.    1) lift up on toes, start with 5x per day and work up to 20x   2) stand and lift on leg straight out to the side so that foot is a few inches of the floor, start with 5x each side and work up to 20x each side   3) stand on one foot, start with 5 seconds each side and work up to 20 seconds on each side  If you need ideas or help with getting more active:  -Silver sneakers https://tools.silversneakers.com  -Walk with a Doc: http://www.duncan-williams.com/  -try to include resistance (weight lifting/strength building) and balance exercises twice per week: or the following link for ideas: http://castillo-powell.com/  BuyDucts.dk  STRESS MANAGEMENT: -can try meditating, or just sitting quietly with deep breathing while intentionally relaxing all parts of your body for 5 minutes daily -if you need further help with stress, anxiety or depression please follow up with your primary doctor or contact the wonderful folks at [facility]: [phone]  SOCIAL CONNECTIONS: -options in [place] if you wish to engage in more social and exercise related activities:  -Silver sneakers https://tools.silversneakers.com  -Walk with a Doc: http://www.duncan-williams.com/  -Check out the [employer] Active Adults 50+ section on the [place] of [employer] (hiking clubs, book clubs, cards and games, chess, exercise classes, aquatic classes and much more) - see the website for  details: https://www.[place].gov/departments/parks-recreation/active-adults50  -YouTube has lots of exercise videos for different ages and abilities as well  -[facility] (a variety of indoor and outdoor inperson activities for adults). 9282413586. [address]  -Virtual Online Classes (a variety of topics): see seniorplanet.org or call [phone])[phone]  -consider volunteering at a school, hospice center, church, senior center or elsewhere    ADVANCED HEALTHCARE DIRECTIVES:  [place] Advanced Directives assistance:   ExpressWeek.com.cy  Everyone should have advanced health care directives in place. This is so that you get the care you want, should you ever be in a situation where you are unable to make your own medical decisions.   From the [place] Advanced Directive Website: "Advance Health Care Directives are legal documents in which you give written instructions about your health care if, in the future, you cannot speak for yourself.   A health care power of attorney allows you to name a person you trust to make your health care decisions if you cannot make them yourself. A declaration of a desire for a natural death (or living will) is document, which states that you desire not to have your life prolonged by extraordinary measures if you have a terminal or incurable illness or if you are in a vegetative state. An advance instruction for mental health treatment makes a declaration of instructions, information and preferences regarding your mental health treatment. It also states that you are aware that the advance instruction authorizes a  mental health treatment provider to act according to your wishes. It may also outline your consent or refusal of mental health treatment. A declaration of an anatomical gift allows anyone over the age of 69 to make a gift by will, organ donor card or other document."   Please  see the following website or an elder law attorney for forms, FAQs and for completion of advanced directives: [place] TEFL teacher Health Care Directives Advance Health Care Directives (http://guzman.com/)  Or copy and paste the following to your web browser: PoshChat.fi         Terressa Koyanagi, DO

## 2023-10-01 NOTE — Patient Instructions (Signed)
I really enjoyed getting to talk with you today! I am available on Tuesdays and Thursdays for virtual visits if you have any questions or concerns, or if I can be of any further assistance.   CHECKLIST FROM ANNUAL WELLNESS VISIT:  -Follow up (please call to schedule if not scheduled after visit):   -yearly for annual wellness visit with primary care office  Here is a list of your preventive care/health maintenance measures and the plan for each if any are due:  PLAN For any measures below that may be due:   Health Maintenance  Topic Date Due   COVID-19 Vaccine (4 - 2024-25 season) 10/17/2023 (Originally 06/09/2023)   DTaP/Tdap/Td (2 - Td or Tdap) 04/02/2024   Medicare Annual Wellness (AWV)  09/30/2024   Colonoscopy  10/30/2032   Pneumonia Vaccine 73+ Years old  Completed   INFLUENZA VACCINE  Completed   Hepatitis C Screening  Completed   Zoster Vaccines- Shingrix  Completed   HPV VACCINES  Aged Out    -See a dentist at least yearly  -Get your eyes checked and then per your eye specialist's recommendations  -Other issues addressed today:   -I have included below further information regarding a healthy whole foods based diet, physical activity guidelines for adults, stress management and opportunities for social connections. I hope you find this information useful.   -----------------------------------------------------------------------------------------------------------------------------------------------------------------------------------------------------------------------------------------------------------  NUTRITION: -eat real food: lots of colorful vegetables (half the plate) and fruits -5-7 servings of vegetables and fruits per day (fresh or steamed is best), exp. 2 servings of vegetables with lunch and dinner and 2 servings of fruit per day. Berries and greens such as kale and collards are great choices.  -consume on a regular basis: whole grains (make sure first  ingredient on label contains the word "whole"), fresh fruits, fish, nuts, seeds, healthy oils (such as olive oil, avocado oil, grape seed oil) -may eat small amounts of dairy and lean meat on occasion, but avoid processed meats such as ham, bacon, lunch meat, etc. -drink water -try to avoid fast food and pre-packaged foods, processed meat -most experts advise limiting sodium to < 2300mg  per day, should limit further is any chronic conditions such as high blood pressure, heart disease, diabetes, etc. The American Heart Association advised that < 1500mg  is is ideal -try to avoid foods that contain any ingredients with names you do not recognize  -try to avoid sugar/sweets (except for the natural sugar that occurs in fresh fruit) -try to avoid sweet drinks -try to avoid white rice, white bread, pasta (unless whole grain), white or yellow potatoes  EXERCISE GUIDELINES FOR ADULTS: -if you wish to increase your physical activity, do so gradually and with the approval of your doctor -STOP and seek medical care immediately if you have any chest pain, chest discomfort or trouble breathing when starting or increasing exercise  -move and stretch your body, legs, feet and arms when sitting for long periods -Physical activity guidelines for optimal health in adults: -least 150 minutes per week of aerobic exercise (can talk, but not sing) once approved by your doctor, 20-30 minutes of sustained activity or two 10 minute episodes of sustained activity every day.  -resistance training at least 2 days per week if approved by your doctor -balance exercises 3+ days per week:   Stand somewhere where you have something sturdy to hold onto if you lose balance.    1) lift up on toes, start with 5x per day and work up to 20x  2) stand and lift on leg straight out to the side so that foot is a few inches of the floor, start with 5x each side and work up to 20x each side   3) stand on one foot, start with 5 seconds each  side and work up to 20 seconds on each side  If you need ideas or help with getting more active:  -Silver sneakers https://tools.silversneakers.com  -Walk with a Doc: http://www.duncan-williams.com/  -try to include resistance (weight lifting/strength building) and balance exercises twice per week: or the following link for ideas: http://castillo-powell.com/  BuyDucts.dk  STRESS MANAGEMENT: -can try meditating, or just sitting quietly with deep breathing while intentionally relaxing all parts of your body for 5 minutes daily -if you need further help with stress, anxiety or depression please follow up with your primary doctor or contact the wonderful folks at [facility]: [phone]  SOCIAL CONNECTIONS: -options in [place] if you wish to engage in more social and exercise related activities:  -Silver sneakers https://tools.silversneakers.com  -Walk with a Doc: http://www.duncan-williams.com/  -Check out the [facility] Active Adults 50+ section on the [place] [employer] (hiking clubs, book clubs, cards and games, chess, exercise classes, aquatic classes and much more) - see the website for details: https://www.[place].gov/departments/parks-recreation/active-adults50  -YouTube has lots of exercise videos for different ages and abilities as well  -[facility] (a variety of indoor and outdoor inperson activities for adults). [phone]. [address]  -Virtual Online Classes (a variety of topics): see seniorplanet.org or call [phone]  -consider volunteering at a school, hospice center, church, senior center or elsewhere    ADVANCED HEALTHCARE DIRECTIVES:  [place] Advanced Directives assistance:   ExpressWeek.com.cy  Everyone should have advanced health care directives in place. This is  so that you get the care you want, should you ever be in a situation where you are unable to make your own medical decisions.   From the  Advanced Directive Website: "Advance Health Care Directives are legal documents in which you give written instructions about your health care if, in the future, you cannot speak for yourself.   A health care power of attorney allows you to name a person you trust to make your health care decisions if you cannot make them yourself. A declaration of a desire for a natural death (or living will) is document, which states that you desire not to have your life prolonged by extraordinary measures if you have a terminal or incurable illness or if you are in a vegetative state. An advance instruction for mental health treatment makes a declaration of instructions, information and preferences regarding your mental health treatment. It also states that you are aware that the advance instruction authorizes a mental health treatment provider to act according to your wishes. It may also outline your consent or refusal of mental health treatment. A declaration of an anatomical gift allows anyone over the age of 58 to make a gift by will, organ donor card or other document."   Please see the following website or an elder law attorney for forms, FAQs and for completion of advanced directives: [place] Health Care Directives Advance Health Care Directives (http://guzman.com/)  Or copy and paste the following to your web browser: PoshChat.fi

## 2023-10-11 ENCOUNTER — Other Ambulatory Visit: Payer: Self-pay | Admitting: Adult Health

## 2023-10-11 DIAGNOSIS — K2100 Gastro-esophageal reflux disease with esophagitis, without bleeding: Secondary | ICD-10-CM

## 2023-10-17 DIAGNOSIS — G4733 Obstructive sleep apnea (adult) (pediatric): Secondary | ICD-10-CM | POA: Diagnosis not present

## 2023-10-22 DIAGNOSIS — H4302 Vitreous prolapse, left eye: Secondary | ICD-10-CM | POA: Diagnosis not present

## 2023-10-22 DIAGNOSIS — H33023 Retinal detachment with multiple breaks, bilateral: Secondary | ICD-10-CM | POA: Diagnosis not present

## 2023-10-22 DIAGNOSIS — H35033 Hypertensive retinopathy, bilateral: Secondary | ICD-10-CM | POA: Diagnosis not present

## 2023-10-22 DIAGNOSIS — H3581 Retinal edema: Secondary | ICD-10-CM | POA: Diagnosis not present

## 2023-10-22 DIAGNOSIS — Z961 Presence of intraocular lens: Secondary | ICD-10-CM | POA: Diagnosis not present

## 2023-10-22 DIAGNOSIS — T8522XD Displacement of intraocular lens, subsequent encounter: Secondary | ICD-10-CM | POA: Diagnosis not present

## 2023-10-26 DIAGNOSIS — G4733 Obstructive sleep apnea (adult) (pediatric): Secondary | ICD-10-CM | POA: Diagnosis not present

## 2023-11-10 ENCOUNTER — Other Ambulatory Visit: Payer: Self-pay | Admitting: Adult Health

## 2023-11-10 DIAGNOSIS — K2100 Gastro-esophageal reflux disease with esophagitis, without bleeding: Secondary | ICD-10-CM

## 2023-11-13 ENCOUNTER — Encounter: Payer: Self-pay | Admitting: Adult Health

## 2023-11-13 ENCOUNTER — Ambulatory Visit: Payer: PPO | Admitting: Adult Health

## 2023-11-13 VITALS — BP 128/82 | HR 79 | Temp 98.4°F | Ht 71.5 in | Wt 234.0 lb

## 2023-11-13 DIAGNOSIS — N401 Enlarged prostate with lower urinary tract symptoms: Secondary | ICD-10-CM | POA: Diagnosis not present

## 2023-11-13 DIAGNOSIS — Z0000 Encounter for general adult medical examination without abnormal findings: Secondary | ICD-10-CM

## 2023-11-13 DIAGNOSIS — G4733 Obstructive sleep apnea (adult) (pediatric): Secondary | ICD-10-CM

## 2023-11-13 DIAGNOSIS — E538 Deficiency of other specified B group vitamins: Secondary | ICD-10-CM

## 2023-11-13 DIAGNOSIS — R351 Nocturia: Secondary | ICD-10-CM | POA: Diagnosis not present

## 2023-11-13 DIAGNOSIS — K219 Gastro-esophageal reflux disease without esophagitis: Secondary | ICD-10-CM

## 2023-11-13 DIAGNOSIS — N529 Male erectile dysfunction, unspecified: Secondary | ICD-10-CM | POA: Diagnosis not present

## 2023-11-13 DIAGNOSIS — E782 Mixed hyperlipidemia: Secondary | ICD-10-CM

## 2023-11-13 DIAGNOSIS — I10 Essential (primary) hypertension: Secondary | ICD-10-CM | POA: Diagnosis not present

## 2023-11-13 LAB — LIPID PANEL
Cholesterol: 147 mg/dL (ref 0–200)
HDL: 45.5 mg/dL (ref >39.00)
LDL Cholesterol: 81 mg/dL (ref 0–99)
NonHDL: 101.11
Total CHOL/HDL Ratio: 3
Triglycerides: 99 mg/dL (ref 0.0–149.0)
VLDL: 19.8 mg/dL (ref 0.0–40.0)

## 2023-11-13 LAB — PSA: PSA: 1.7 ng/mL (ref 0.10–4.00)

## 2023-11-13 LAB — CBC
HCT: 45.4 % (ref 39.0–52.0)
Hemoglobin: 15.3 g/dL (ref 13.0–17.0)
MCHC: 33.7 g/dL (ref 30.0–36.0)
MCV: 92.2 fL (ref 78.0–100.0)
Platelets: 225 10*3/uL (ref 150.0–400.0)
RBC: 4.92 Mil/uL (ref 4.22–5.81)
RDW: 12.9 % (ref 11.5–15.5)
WBC: 4.6 10*3/uL (ref 4.0–10.5)

## 2023-11-13 LAB — COMPREHENSIVE METABOLIC PANEL
ALT: 28 U/L (ref 0–53)
AST: 18 U/L (ref 0–37)
Albumin: 4.6 g/dL (ref 3.5–5.2)
Alkaline Phosphatase: 70 U/L (ref 39–117)
BUN: 16 mg/dL (ref 6–23)
CO2: 25 meq/L (ref 19–32)
Calcium: 8.9 mg/dL (ref 8.4–10.5)
Chloride: 105 meq/L (ref 96–112)
Creatinine, Ser: 0.92 mg/dL (ref 0.40–1.50)
GFR: 84.97 mL/min (ref >60.00)
Glucose, Bld: 103 mg/dL — ABNORMAL HIGH (ref 70–99)
Potassium: 4.2 meq/L (ref 3.5–5.1)
Sodium: 140 meq/L (ref 135–145)
Total Bilirubin: 0.8 mg/dL (ref 0.2–1.2)
Total Protein: 6.9 g/dL (ref 6.0–8.3)

## 2023-11-13 LAB — VITAMIN B12: Vitamin B-12: 446 pg/mL (ref 211–911)

## 2023-11-13 LAB — TSH: TSH: 3.62 u[IU]/mL (ref 0.35–5.50)

## 2023-11-13 NOTE — Patient Instructions (Signed)
 It was great seeing you today   We will follow up with you regarding your lab work   Please let me know if you need anything

## 2023-11-13 NOTE — Progress Notes (Signed)
 Subjective:    Patient ID: Raymond Mann, male    DOB: 07-06-1956, 68 y.o.   MRN: [id number]  HPI Patient presents for yearly preventative medicine examination. He is a pleasant 68 year old male who  has a past medical history of Cataract of right eye, ED (erectile dysfunction), Essential hypertension, Fatty liver, Hyperlipidemia, Myopia, Ocular disease, Pseudophakia of left eye, Retinal detachment, Shingles, and Sight deterioration.  Hypertension-takes Norvasc  5 mg daily.  He denies dizziness, lightheadedness, chest pain, or shortness of breath.  BP Readings from Last 3 Encounters:  11/13/23 128/82  05/29/23 128/70  05/23/23 130/88   Hyperlipidemia-is controlled with pravastatin  20 mg daily.  He denies myalgia or fatigue Lab Results  Component Value Date   CHOL 170 11/08/2022   HDL 43.10 11/08/2022   LDLCALC 90 11/08/2022   TRIG 186.0 (H) 11/08/2022   CHOLHDL 4 11/08/2022   Erectile dysfunction-takes sildenafil  PRN   Nocturia - awakes about 1 times a night to urinate. Denies decreased stream or incomplete bladder emptying.   OSA he had a home sleep study done in February 2024 that showed severe sleep apnea with AHI of 68/h and SpO2 low at 73%.  He was started on CPAP since starting CPAP he has noted benefits such as decreased daytime sleepiness and feels as though his energy level has improved.  Excellent compliance with 100% usage and average daily use of 10 hours.  AHI is dropped to 3.6 an hour.  GERD - has symptoms mostly at night a few times a week. He has been taking OTC medication as needed   Vitamin B12 deficiency - takes B12 supplements.    All immunizations and health maintenance protocols were reviewed with the patient and needed orders were placed.  Appropriate screening laboratory values were ordered for the patient including screening of hyperlipidemia, renal function and hepatic function. If indicated by BPH, a PSA was ordered.  Medication reconciliation,  past  medical history, social history, problem list and allergies were reviewed in detail with the patient  Goals were established with regard to weight loss, exercise, and  diet in compliance with medications. He is working with a Programme Researcher, Broadcasting/film/video (cognitive function study)  where he does strength training and walking at least 150 min a week.  Wt Readings from Last 10 Encounters:  11/13/23 234 lb (106.1 kg)  05/29/23 240 lb 6.4 oz (109 kg)  05/23/23 245 lb (111.1 kg)  05/08/23 247 lb (112 kg)  05/02/23 239 lb (108.4 kg)  03/29/23 239 lb (108.4 kg)  03/06/23 239 lb 12.8 oz (108.8 kg)  02/26/23 238 lb 9.6 oz (108.2 kg)  01/29/23 239 lb (108.4 kg)  12/24/22 237 lb 12.8 oz (107.9 kg)    He is up to date on routine colon cancer screening    Review of Systems  Constitutional: Negative.   HENT: Negative.    Eyes: Negative.   Respiratory: Negative.    Cardiovascular: Negative.   Gastrointestinal: Negative.   Endocrine: Negative.   Genitourinary: Negative.   Musculoskeletal: Negative.   Skin: Negative.   Allergic/Immunologic: Negative.   Neurological: Negative.   Hematological: Negative.   Psychiatric/Behavioral: Negative.    All other systems reviewed and are negative.  Past Medical History:  Diagnosis Date   Cataract of right eye    ED (erectile dysfunction)    Essential hypertension    Fatty liver    Hyperlipidemia    Myopia    Ocular disease    Pseudophakia of left eye  Retinal detachment    both eyes   Shingles    Sight deterioration    right eye    Social History   Socioeconomic History   Marital status: Married    Spouse name: Not on file   Number of children: 2   Years of education: Not on file   Highest education level: Bachelor's degree (e.g., BA, AB, BS)  Occupational History   Not on file  Tobacco Use   Smoking status: Never   Smokeless tobacco: Never  Substance and Sexual Activity   Alcohol use: Yes    Comment: occasionally   Drug use: No   Sexual  activity: Not on file  Other Topics Concern   Not on file  Social History 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    One grandchild       Social Drivers of Health   Financial Resource Strain: Low Risk  (09/26/2022)   Overall Financial Resource Strain (CARDIA)    Difficulty of Paying Living Expenses: Not hard at all  Food Insecurity: No Food Insecurity (09/26/2022)   Hunger Vital Sign    Worried About Running Out of Food in the Last Year: Never true    Ran Out of Food in the Last Year: Never true  Transportation Needs: No Transportation Needs (09/26/2022)   PRAPARE - Administrator, Civil Service (Medical): No    Lack of Transportation (Non-Medical): No  Physical Activity: Inactive (09/26/2022)   Exercise Vital Sign    Days of Exercise per Week: 0 days    Minutes of Exercise per Session: 0 min  Stress: No Stress Concern Present (09/26/2022)   [employer] of Occupational Health - Occupational Stress Questionnaire    Feeling of Stress : Not at all  Social Connections: Socially Integrated (09/26/2022)   Social Connection and Isolation Panel [NHANES]    Frequency of Communication with Friends and Family: More than three times a week    Frequency of Social Gatherings with Friends and Family: More than three times a week    Attends Religious Services: More than 4 times per year    Active Member of [employer] or Organizations: Yes    Attends Banker Meetings: More than 4 times per year    Marital Status: Married  Catering Manager Violence: Not At Risk (09/26/2022)   Humiliation, Afraid, Rape, and Kick questionnaire    Fear of Current or Ex-Partner: No    Emotionally Abused: No    Physically Abused: No    Sexually Abused: No    Past Surgical History:  Procedure Laterality Date   bilateral retinal detachment     x2 each eye   CAPSULOTOMY  10/09/1999   left eye   cryopexy  10/08/1993   right eye   ENDOVENOUS ABLATION  SAPHENOUS VEIN W/ LASER Left 05/02/2023   endovenous laser ablation left small saphenous vein and stab phlebectomy 10-20 incisions left leg by Penne Colorado MD   scleral buckling  10/08/1993   left eye   VITRECTOMY  10/08/1993   both eyes    Family History  Problem Relation Age of Onset   Stroke Father    Heart attack Father    Dementia Mother    Stomach cancer Mother 5   Retinal detachment Brother     Allergies  Allergen Reactions   Bee Venom Swelling   Lisinopril Cough   Norvasc  [Amlodipine ]  Leg swelling    Nutritional Supplements Other (See Comments)    Itching, possibly    Current Outpatient Medications on File Prior to Visit  Medication Sig Dispense Refill   albuterol  (VENTOLIN  HFA) 108 (90 Base) MCG/ACT inhaler Inhale 2 puffs into the lungs every 6 (six) hours as needed for wheezing or shortness of breath. 8 g 0   benzonatate  (TESSALON ) 200 MG capsule Take 1 capsule (200 mg total) by mouth 3 (three) times daily as needed. 45 capsule 3   carboxymethylcellulose (REFRESH TEARS) 0.5 % SOLN Apply to eye.     EPINEPHrine  (EPIPEN  2-PAK) 0.3 mg/0.3 mL IJ SOAJ injection as directed Injection 1 each 1   fluticasone -salmeterol (ADVAIR DISKUS) 100-50 MCG/ACT AEPB Inhale 1 puff into the lungs 2 (two) times daily. 3 each 3   latanoprost (XALATAN) 0.005 % ophthalmic solution Place 1 drop into the left eye nightly.     olmesartan  (BENICAR ) 5 MG tablet Take 1 tablet (5 mg total) by mouth daily. 90 tablet 3   omeprazole  (PRILOSEC) 40 MG capsule Take 1 capsule by mouth once daily 30 capsule 0   OVER THE COUNTER MEDICATION Occuvite-daily     pravastatin  (PRAVACHOL ) 20 MG tablet Take 1 tablet (20 mg total) by mouth at bedtime. 90 tablet PRN   Probiotic Product (PROBIOTIC PO) Take by mouth.     sildenafil  (REVATIO ) 20 MG tablet Take 1-5 tablets PRN 90 tablet 2   VITAMIN D PO Take 250 mg by mouth daily.     No current facility-administered medications on file prior to visit.    BP  128/82   Pulse 79   Temp 98.4 F (36.9 C) (Oral)   Ht 5' 11.5 (1.816 m)   Wt 234 lb (106.1 kg)   SpO2 97%   BMI 32.18 kg/m        Objective:   Physical Exam Vitals and nursing note reviewed.  Constitutional:      General: He is not in acute distress.    Appearance: Normal appearance. He is obese. He is not ill-appearing.  HENT:     Head: Normocephalic and atraumatic.     Right Ear: Tympanic membrane, ear canal and external ear normal. There is no impacted cerumen.     Left Ear: Tympanic membrane, ear canal and external ear normal. There is no impacted cerumen.     Nose: Nose normal. No congestion or rhinorrhea.     Mouth/Throat:     Mouth: Mucous membranes are moist.     Pharynx: Oropharynx is clear.  Eyes:     Extraocular Movements: Extraocular movements intact.     Conjunctiva/sclera: Conjunctivae normal.     Pupils: Pupils are equal, round, and reactive to light.  Neck:     Vascular: No carotid bruit.  Cardiovascular:     Rate and Rhythm: Normal rate and regular rhythm.     Pulses: Normal pulses.     Heart sounds: No murmur heard.    No friction rub. No gallop.  Pulmonary:     Effort: Pulmonary effort is normal.     Breath sounds: Normal breath sounds.  Abdominal:     General: Abdomen is flat. Bowel sounds are normal. There is no distension.     Palpations: Abdomen is soft. There is no mass.     Tenderness: There is no abdominal tenderness. There is no guarding or rebound.     Hernia: No hernia is present.  Musculoskeletal:        General: Normal  range of motion.     Cervical back: Normal range of motion and neck supple.  Lymphadenopathy:     Cervical: No cervical adenopathy.  Skin:    General: Skin is warm and dry.     Capillary Refill: Capillary refill takes less than 2 seconds.  Neurological:     General: No focal deficit present.     Mental Status: He is alert and oriented to person, place, and time.  Psychiatric:        Mood and Affect: Mood normal.         Behavior: Behavior normal.        Thought Content: Thought content normal.        Judgment: Judgment normal.       Assessment & Plan:  1. Routine general medical examination at a health care facility (Primary) Today patient counseled on age appropriate routine health concerns for screening and prevention, each reviewed and up to date or declined. Immunizations reviewed and up to date or declined. Labs ordered and reviewed. Risk factors for depression reviewed and negative. Hearing function and visual acuity are intact. ADLs screened and addressed as needed. Functional ability and level of safety reviewed and appropriate. Education, counseling and referrals performed based on assessed risks today. Patient provided with a copy of personalized plan for preventive services. - Continue to work on weight loss through diet and exercise - Follow up in one year or sooner if needed  2. Essential hypertension - Well controlled. No change in medication  - Lipid panel; Future - TSH; Future - CBC; Future - Comprehensive metabolic panel; Future  3. Mixed hyperlipidemia - Consider increase in statin. He is interested in CT cardiac scoring  - Lipid panel; Future - TSH; Future - CBC; Future - Comprehensive metabolic panel; Future - CT CARDIAC SCORING (SELF PAY ONLY); Future  4. Erectile dysfunction, unspecified erectile dysfunction type - Continue with current therapy  - Lipid panel; Future - TSH; Future - CBC; Future - Comprehensive metabolic panel; Future  5. BPH associated with nocturia - Hold off on medication at this time  - PSA; Future  6. OSA (obstructive sleep apnea) - Continue with CPAP - Lipid panel; Future - TSH; Future - CBC; Future - Comprehensive metabolic panel; Future  7. Gastroesophageal reflux disease without esophagitis - Continue PPI  - Lipid panel; Future - TSH; Future - CBC; Future - Comprehensive metabolic panel; Future  8. Vitamin B 12 deficiency  -  Vitamin B12; Future  Darleene Shape, NP

## 2023-11-26 DIAGNOSIS — G4733 Obstructive sleep apnea (adult) (pediatric): Secondary | ICD-10-CM | POA: Diagnosis not present

## 2023-11-28 ENCOUNTER — Ambulatory Visit (HOSPITAL_COMMUNITY)
Admission: RE | Admit: 2023-11-28 | Discharge: 2023-11-28 | Disposition: A | Discharge status: Home / Self Care | Payer: Self-pay | Source: Ambulatory Visit | Attending: Adult Health | Admitting: Adult Health

## 2023-11-28 DIAGNOSIS — E782 Mixed hyperlipidemia: Secondary | ICD-10-CM | POA: Insufficient documentation

## 2023-11-29 ENCOUNTER — Encounter: Payer: Self-pay | Admitting: Adult Health

## 2023-12-03 ENCOUNTER — Other Ambulatory Visit: Payer: Self-pay | Admitting: Adult Health

## 2023-12-03 MED ORDER — ROSUVASTATIN CALCIUM 20 MG PO TABS: 20.0000 mg | ORAL_TABLET | Freq: Every day | ORAL | 3 refills | Status: DC

## 2023-12-08 ENCOUNTER — Other Ambulatory Visit: Payer: Self-pay | Admitting: Adult Health

## 2023-12-08 DIAGNOSIS — K2100 Gastro-esophageal reflux disease with esophagitis, without bleeding: Secondary | ICD-10-CM

## 2023-12-19 ENCOUNTER — Telehealth (INDEPENDENT_AMBULATORY_CARE_PROVIDER_SITE_OTHER): Admitting: Adult Health

## 2023-12-19 VITALS — Ht 71.5 in | Wt 234.0 lb

## 2023-12-19 DIAGNOSIS — R051 Acute cough: Secondary | ICD-10-CM

## 2023-12-19 MED ORDER — METHYLPREDNISOLONE 4 MG PO TBPK: ORAL_TABLET | ORAL | 0 refills | Status: DC

## 2023-12-19 NOTE — Progress Notes (Signed)
 Virtual Visit via Video Note  I connected with [name] on 12/19/23 at  9:30 AM EDT by a video enabled telemedicine application and verified that I am speaking with the correct person using two identifiers.  Location patient: home Location provider:work or home office Persons participating in the virtual visit: patient, provider  I discussed the limitations of evaluation and management by telemedicine and the availability of in person appointments. The patient expressed understanding and agreed to proceed.   HPI: This 68 year old male who is being evaluated today for an acute issue.  He reports that he has had a cough on and on for the last month.  His cough is productive in the morning but then turns or nonproductive during the day.  He does have trouble sleeping at night due to the cough.  At home he has tried Delsym, NyQuil, DayQuil, and [employer] without much improvement.  He has not had any fevers, chills, sinus pain or pressure, shortness of breath or wheezing.   ROS: See pertinent positives and negatives per HPI.  Past Medical History:  Diagnosis Date   Cataract of right eye    ED (erectile dysfunction)    Essential hypertension    Fatty liver    Hyperlipidemia    Myopia    Ocular disease    Pseudophakia of left eye    Retinal detachment    both eyes   Shingles    Sight deterioration    right eye    Past Surgical History:  Procedure Laterality Date   bilateral retinal detachment     x2 each eye   CAPSULOTOMY  10/09/1999   left eye   cryopexy  10/08/1993   right eye   ENDOVENOUS ABLATION SAPHENOUS VEIN W/ LASER Left 05/02/2023   endovenous laser ablation left small saphenous vein and stab phlebectomy 10-20 incisions left leg by Lemar Livings MD   scleral buckling  10/08/1993   left eye   VITRECTOMY  10/08/1993   both eyes    Family History  Problem Relation Age of Onset   Stroke Father    Heart attack Father    Dementia Mother    Stomach cancer  Mother 9   Retinal detachment Brother        Current Outpatient Medications:    albuterol (VENTOLIN HFA) 108 (90 Base) MCG/ACT inhaler, Inhale 2 puffs into the lungs every 6 (six) hours as needed for wheezing or shortness of breath., Disp: 8 g, Rfl: 0   benzonatate (TESSALON) 200 MG capsule, Take 1 capsule (200 mg total) by mouth 3 (three) times daily as needed., Disp: 45 capsule, Rfl: 3   carboxymethylcellulose (REFRESH TEARS) 0.5 % SOLN, Apply to eye., Disp: , Rfl:    EPINEPHrine (EPIPEN 2-PAK) 0.3 mg/0.3 mL IJ SOAJ injection, as directed Injection, Disp: 1 each, Rfl: 1   fluticasone-salmeterol (ADVAIR DISKUS) 100-50 MCG/ACT AEPB, Inhale 1 puff into the lungs 2 (two) times daily., Disp: 3 each, Rfl: 3   latanoprost (XALATAN) 0.005 % ophthalmic solution, Place 1 drop into the left eye nightly., Disp: , Rfl:    olmesartan (BENICAR) 5 MG tablet, Take 1 tablet (5 mg total) by mouth daily., Disp: 90 tablet, Rfl: 3   omeprazole (PRILOSEC) 40 MG capsule, Take 1 capsule by mouth once daily, Disp: 30 capsule, Rfl: 0   OVER THE COUNTER MEDICATION, Occuvite-daily, Disp: , Rfl:    Probiotic Product (PROBIOTIC PO), Take by mouth., Disp: , Rfl:    rosuvastatin (CRESTOR) 20 MG tablet, Take  1 tablet (20 mg total) by mouth daily., Disp: 90 tablet, Rfl: 3   sildenafil (REVATIO) 20 MG tablet, Take 1-5 tablets PRN, Disp: 90 tablet, Rfl: 2   VITAMIN D PO, Take 250 mg by mouth daily., Disp: , Rfl:   EXAM:  VITALS per patient if applicable:  GENERAL: alert, oriented, appears well and in no acute distress  HEENT: atraumatic, conjunttiva clear, no obvious abnormalities on inspection of external nose and ears  NECK: normal movements of the head and neck  LUNGS: on inspection no signs of respiratory distress, breathing rate appears normal, no obvious gross SOB, gasping or wheezing  CV: no obvious cyanosis  MS: moves all visible extremities without noticeable abnormality  PSYCH/NEURO: pleasant and  cooperative, no obvious depression or anxiety, speech and thought processing grossly intact  ASSESSMENT AND PLAN:  Discussed the following assessment and plan:  1. Acute cough (Primary) -Likely viral or postviral cough.  Does not seem to have bacterial sinusitis.  Will treat with Medrol Dosepak.  Follow-up if not resolved after completion of steroids - methylPREDNISolone (MEDROL DOSEPAK) 4 MG TBPK tablet; Take as directed  Dispense: 21 tablet; Refill: 0      I discussed the assessment and treatment plan with the patient. The patient was provided an opportunity to ask questions and all were answered. The patient agreed with the plan and demonstrated an understanding of the instructions.   The patient was advised to call back or seek an in-person evaluation if the symptoms worsen or if the condition fails to improve as anticipated.   [name], NP

## 2023-12-24 DIAGNOSIS — G4733 Obstructive sleep apnea (adult) (pediatric): Secondary | ICD-10-CM | POA: Diagnosis not present

## 2024-01-06 DIAGNOSIS — G4733 Obstructive sleep apnea (adult) (pediatric): Secondary | ICD-10-CM | POA: Diagnosis not present

## 2024-01-08 ENCOUNTER — Other Ambulatory Visit: Payer: Self-pay | Admitting: Adult Health

## 2024-01-08 DIAGNOSIS — K2100 Gastro-esophageal reflux disease with esophagitis, without bleeding: Secondary | ICD-10-CM

## 2024-01-24 DIAGNOSIS — G4733 Obstructive sleep apnea (adult) (pediatric): Secondary | ICD-10-CM | POA: Diagnosis not present

## 2024-02-03 ENCOUNTER — Other Ambulatory Visit: Payer: Self-pay | Admitting: Adult Health

## 2024-02-03 DIAGNOSIS — K2100 Gastro-esophageal reflux disease with esophagitis, without bleeding: Secondary | ICD-10-CM

## 2024-02-05 ENCOUNTER — Ambulatory Visit: Payer: PPO | Admitting: Pulmonary Disease

## 2024-02-05 ENCOUNTER — Encounter: Payer: Self-pay | Admitting: Pulmonary Disease

## 2024-02-05 VITALS — BP 125/79 | HR 70 | Temp 97.5°F | Ht 71.0 in | Wt 235.6 lb

## 2024-02-05 DIAGNOSIS — G4733 Obstructive sleep apnea (adult) (pediatric): Secondary | ICD-10-CM

## 2024-02-05 DIAGNOSIS — R053 Chronic cough: Secondary | ICD-10-CM

## 2024-02-05 DIAGNOSIS — J4530 Mild persistent asthma, uncomplicated: Secondary | ICD-10-CM

## 2024-02-05 DIAGNOSIS — R942 Abnormal results of pulmonary function studies: Secondary | ICD-10-CM

## 2024-02-05 MED ORDER — FLUTICASONE-SALMETEROL 250-50 MCG/ACT IN AEPB
1.0000 | INHALATION_SPRAY | Freq: Two times a day (BID) | RESPIRATORY_TRACT | 5 refills | Status: DC
Start: 2024-02-05 — End: 2024-07-20

## 2024-02-05 NOTE — Patient Instructions (Addendum)
 Increase Advair to 250 from Advair 100  Repeat pulmonary function test  Follow-up in 8 to 12 weeks  Call us  with significant concerns  Continue medications for reflux  Continue Allegra  Download from your CPAP shows it is working well  If there continues to be issues with your swallowing, we should consider a swallowing test and also possible evaluation by a gastroenterologist

## 2024-02-05 NOTE — Progress Notes (Signed)
 Raymond Mann    [id number]    [birth year]  Primary Care Physician:Nafziger, Randel Buss, NP  Referring Physician: Alto Atta, NP [address] [place],  [place] [address]  Chief complaint: Chronic cough Obstructive sleep apnea  HPI:  Diagnosis of severe obstructive sleep apnea Tolerating CPAP well Waking up feeling like his good nights rest  Chronic cough Ongoing for many years - Does not seem to be getting better Was recently started on Advair 100, does use medications for allergies Does use medications for reflux  Does some throat clearing Medications for allergies  Has not really noticed any significant changes with his cough  He does use Tessalon  Perles  Recently had blood pressure medications changes well as one may have contributed to his coughing  Never smoked, was exposed to secondhand smoke  Outpatient Encounter Medications as of 02/05/2024  Medication Sig   albuterol  (VENTOLIN  HFA) 108 (90 Base) MCG/ACT inhaler Inhale 2 puffs into the lungs every 6 (six) hours as needed for wheezing or shortness of breath.   augmented betamethasone  dipropionate (DIPROLENE -AF) 0.05 % cream    benzonatate  (TESSALON ) 200 MG capsule Take 1 capsule (200 mg total) by mouth 3 (three) times daily as needed.   carboxymethylcellulose (REFRESH TEARS) 0.5 % SOLN Apply to eye.   EPINEPHrine  (EPIPEN  2-PAK) 0.3 mg/0.3 mL IJ SOAJ injection as directed Injection   fexofenadine (ALLEGRA) 180 MG tablet Take 180 mg by mouth daily.   fluticasone -salmeterol (ADVAIR DISKUS) 100-50 MCG/ACT AEPB Inhale 1 puff into the lungs 2 (two) times daily.   latanoprost (XALATAN) 0.005 % ophthalmic solution Place 1 drop into the left eye nightly.   olmesartan  (BENICAR ) 5 MG tablet Take 1 tablet (5 mg total) by mouth daily.   omeprazole  (PRILOSEC) 40 MG capsule Take 1 capsule by mouth once daily   OVER THE COUNTER MEDICATION Occuvite-daily   Probiotic Product (PROBIOTIC PO) Take by mouth.    rosuvastatin  (CRESTOR ) 20 MG tablet Take 1 tablet (20 mg total) by mouth daily.   sildenafil  (REVATIO ) 20 MG tablet Take 1-5 tablets PRN   VITAMIN D PO Take 250 mg by mouth daily.   [DISCONTINUED] methylPREDNISolone  (MEDROL  DOSEPAK) 4 MG TBPK tablet Take as directed (Patient not taking: Reported on 02/05/2024)   No facility-administered encounter medications on file as of 02/05/2024.    Allergies as of 02/05/2024 - Review Complete 02/05/2024  Allergen Reaction Noted   Bee venom Swelling 07/25/2020   Lisinopril Cough 09/29/2020   Norvasc  [amlodipine ]  03/06/2023   Nutritional supplements Other (See Comments) 01/25/2012    Past Medical History:  Diagnosis Date   Cataract of right eye    ED (erectile dysfunction)    Essential hypertension    Fatty liver    Hyperlipidemia    Myopia    Ocular disease    Pseudophakia of left eye    Retinal detachment    both eyes   Shingles    Sight deterioration    right eye    Past Surgical History:  Procedure Laterality Date   bilateral retinal detachment     x2 each eye   CAPSULOTOMY  10/09/1999   left eye   cryopexy  10/08/1993   right eye   ENDOVENOUS ABLATION SAPHENOUS VEIN W/ LASER Left 05/02/2023   endovenous laser ablation left small saphenous vein and stab phlebectomy 10-20 incisions left leg by Angela Kell MD   scleral buckling  10/08/1993   left eye   VITRECTOMY  10/08/1993   both eyes    Family History  Problem Relation Age of Onset   Stroke Father    Heart attack Father    Dementia Mother    Stomach cancer Mother 87   Retinal detachment Brother     Social History   Socioeconomic History   Marital status: Married    Spouse name: Not on file   Number of children: 2   Years of education: Not on file   Highest education level: Bachelor's degree (e.g., BA, AB, BS)  Occupational History   Not on file  Tobacco Use   Smoking status: Never   Smokeless tobacco: Never  Substance and Sexual Activity   Alcohol use:  Yes    Comment: occasionally   Drug use: No   Sexual activity: Not on file  Other Topics Concern   Not on file  Social History 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    One grandchild       Social Drivers of Health   Financial Resource Strain: Low Risk  (09/26/2022)   Overall Financial Resource Strain (CARDIA)    Difficulty of Paying Living Expenses: Not hard at all  Food Insecurity: No Food Insecurity (09/26/2022)   Hunger Vital Sign    Worried About Running Out of Food in the Last Year: Never true    Ran Out of Food in the Last Year: Never true  Transportation Needs: No Transportation Needs (09/26/2022)   PRAPARE - Administrator, Civil Service (Medical): No    Lack of Transportation (Non-Medical): No  Physical Activity: Inactive (09/26/2022)   Exercise Vital Sign    Days of Exercise per Week: 0 days    Minutes of Exercise per Session: 0 min  Stress: No Stress Concern Present (09/26/2022)   [employer] of Occupational Health - Occupational Stress Questionnaire    Feeling of Stress : Not at all  Social Connections: Socially Integrated (09/26/2022)   Social Connection and Isolation Panel [NHANES]    Frequency of Communication with Friends and Family: More than three times a week    Frequency of Social Gatherings with Friends and Family: More than three times a week    Attends Religious Services: More than 4 times per year    Active Member of [employer] or Organizations: Yes    Attends Engineer, structural: More than 4 times per year    Marital Status: Married  Catering manager Violence: Not At Risk (09/26/2022)   Humiliation, Afraid, Rape, and Kick questionnaire    Fear of Current or Ex-Partner: No    Emotionally Abused: No    Physically Abused: No    Sexually Abused: No    Review of Systems  Respiratory:  Positive for apnea and cough.   Psychiatric/Behavioral:  Positive for sleep disturbance.     Vitals:    02/05/24 1521  BP: 125/79  Pulse: 70  Temp: (!) 97.5 F (36.4 C)  SpO2: 94%     Physical Exam Constitutional:      Appearance: He is obese.  HENT:     Head: Normocephalic.     Mouth/Throat:     Mouth: Mucous membranes are moist.  Eyes:     General: No scleral icterus. Cardiovascular:     Rate and Rhythm: Normal rate and regular rhythm.     Heart sounds: No murmur heard.    No friction rub.  Pulmonary:  Effort: No respiratory distress.     Breath sounds: No stridor. No wheezing or rhonchi.  Musculoskeletal:     Cervical back: No rigidity or tenderness.  Neurological:     Mental Status: He is alert.  Psychiatric:        Mood and Affect: Mood normal.      Data Reviewed: Pulmonary function test shows airway obstruction with flattening of the expiratory loop - Concerning for variable intrathoracic obstruction, there may be blunting of the inspiratory loop as well which will be in favor of a fixed obstruction-if both lobes are blunted  Sleep study shows severe obstructive sleep apnea with AHI of 68  Download from the machine shows excellent compliance of 100% Average use of 9 hours 13 minutes AutoSet 5-15 Residual AHI of 1 95 percentile pressure of 9.1  Assessment:  Chronic cough  Obstructive sleep apnea  Sleep apnea appears well treated from the download  Chronic cough is likely multifactorial With a lot of throat clearing may be postnasal drip, may be related to exposure to allergens  Abnormal pulmonary function test showing flattened expiratory loop - Will repeat the study to follow-up on this  Plan/Recommendations: Increase dose of Advair to 250 from 100  Repeat breathing study - Looking for any blunting of the flow-volume loop  Continue medications for reflux  Continue Allegra  Will consider swallowing study, referral to GI if ongoing difficulty with swallowing   Myer Artis MD [facility] 02/05/2024, 3:31  PM  CC: Alto Atta, NP

## 2024-02-11 ENCOUNTER — Encounter: Payer: Self-pay | Admitting: Pulmonary Disease

## 2024-02-11 ENCOUNTER — Other Ambulatory Visit: Payer: Self-pay | Admitting: Adult Health

## 2024-02-11 DIAGNOSIS — M25571 Pain in right ankle and joints of right foot: Secondary | ICD-10-CM | POA: Diagnosis not present

## 2024-02-11 DIAGNOSIS — M79671 Pain in right foot: Secondary | ICD-10-CM | POA: Diagnosis not present

## 2024-02-11 NOTE — Telephone Encounter (Signed)
 Advise in last Office visit It was increased to 250

## 2024-02-12 DIAGNOSIS — H40053 Ocular hypertension, bilateral: Secondary | ICD-10-CM | POA: Diagnosis not present

## 2024-02-17 DIAGNOSIS — Z1283 Encounter for screening for malignant neoplasm of skin: Secondary | ICD-10-CM | POA: Diagnosis not present

## 2024-02-17 DIAGNOSIS — L570 Actinic keratosis: Secondary | ICD-10-CM | POA: Diagnosis not present

## 2024-02-17 DIAGNOSIS — X32XXXD Exposure to sunlight, subsequent encounter: Secondary | ICD-10-CM | POA: Diagnosis not present

## 2024-02-17 DIAGNOSIS — D225 Melanocytic nevi of trunk: Secondary | ICD-10-CM | POA: Diagnosis not present

## 2024-02-25 ENCOUNTER — Ambulatory Visit (INDEPENDENT_AMBULATORY_CARE_PROVIDER_SITE_OTHER): Admitting: Adult Health

## 2024-02-25 VITALS — BP 120/80 | HR 76 | Temp 98.0°F | Ht 71.0 in | Wt 230.0 lb

## 2024-02-25 DIAGNOSIS — S39011A Strain of muscle, fascia and tendon of abdomen, initial encounter: Secondary | ICD-10-CM | POA: Diagnosis not present

## 2024-02-25 MED ORDER — CYCLOBENZAPRINE HCL 10 MG PO TABS: 10.0000 mg | ORAL_TABLET | Freq: Every day | ORAL | 0 refills | Status: DC

## 2024-02-25 NOTE — Progress Notes (Signed)
 Subjective:    Patient ID: Raymond Mann, male    DOB: 1956/01/17, 68 y.o.   MRN: [id number]  HPI  68 year old male who  has a past medical history of Cataract of right eye, ED (erectile dysfunction), Essential hypertension, Fatty liver, Hyperlipidemia, Myopia, Ocular disease, Pseudophakia of left eye, Retinal detachment, Shingles, and Sight deterioration.  He presents to the office today for the complaint of abdominal pain x 3 weeks. Pain is felt at cramping sensation. Pain is located below his ribs.His symptoms started after laying 275 bags of mulch.   Cramping sensation happens when he changes positions form sitting to standing. He was able to walk about 2.5 miles today and was also able to do his workout without pain earlier today.    He has not had any issues with bowel or bladder.   Review of Systems See HPI   Past Medical History:  Diagnosis Date   Cataract of right eye    ED (erectile dysfunction)    Essential hypertension    Fatty liver    Hyperlipidemia    Myopia    Ocular disease    Pseudophakia of left eye    Retinal detachment    both eyes   Shingles    Sight deterioration    right eye    Social History   Socioeconomic History   Marital status: Married    Spouse name: Not on file   Number of children: 2   Years of education: Not on file   Highest education level: Bachelor's degree (e.g., BA, AB, BS)  Occupational History   Not on file  Tobacco Use   Smoking status: Never   Smokeless tobacco: Never  Substance and Sexual Activity   Alcohol use: Yes    Comment: occasionally   Drug use: No   Sexual activity: Not on file  Other Topics Concern   Not on file  Social History 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    One grandchild       Social Drivers of Health   Financial Resource Strain: Low Risk  (09/26/2022)   Overall Financial Resource Strain (CARDIA)    Difficulty of Paying Living Expenses: Not hard at all   Food Insecurity: No Food Insecurity (09/26/2022)   Hunger Vital Sign    Worried About Running Out of Food in the Last Year: Never true    Ran Out of Food in the Last Year: Never true  Transportation Needs: No Transportation Needs (09/26/2022)   PRAPARE - Administrator, Civil Service (Medical): No    Lack of Transportation (Non-Medical): No  Physical Activity: Inactive (09/26/2022)   Exercise Vital Sign    Days of Exercise per Week: 0 days    Minutes of Exercise per Session: 0 min  Stress: No Stress Concern Present (09/26/2022)   [employer] of Occupational Health - Occupational Stress Questionnaire    Feeling of Stress : Not at all  Social Connections: Socially Integrated (09/26/2022)   Social Connection and Isolation Panel [NHANES]    Frequency of Communication with Friends and Family: More than three times a week    Frequency of Social Gatherings with Friends and Family: More than three times a week    Attends Religious Services: More than 4 times per year    Active Member of [employer] or Organizations: Yes    Attends Banker Meetings: More than 4  times per year    Marital Status: Married  Catering manager Violence: Not At Risk (09/26/2022)   Humiliation, Afraid, Rape, and Kick questionnaire    Fear of Current or Ex-Partner: No    Emotionally Abused: No    Physically Abused: No    Sexually Abused: No    Past Surgical History:  Procedure Laterality Date   bilateral retinal detachment     x2 each eye   CAPSULOTOMY  10/09/1999   left eye   cryopexy  10/08/1993   right eye   ENDOVENOUS ABLATION SAPHENOUS VEIN W/ LASER Left 05/02/2023   endovenous laser ablation left small saphenous vein and stab phlebectomy 10-20 incisions left leg by Angela Kell MD   scleral buckling  10/08/1993   left eye   VITRECTOMY  10/08/1993   both eyes    Family History  Problem Relation Age of Onset   Stroke Father    Heart attack Father    Dementia Mother     Stomach cancer Mother 56   Retinal detachment Brother     Allergies  Allergen Reactions   Bee Venom Swelling   Lisinopril Cough   Norvasc  [Amlodipine ]     Leg swelling    Nutritional Supplements Other (See Comments)    Itching, possibly    Current Outpatient Medications on File Prior to Visit  Medication Sig Dispense Refill   albuterol  (VENTOLIN  HFA) 108 (90 Base) MCG/ACT inhaler Inhale 2 puffs into the lungs every 6 (six) hours as needed for wheezing or shortness of breath. 8 g 0   augmented betamethasone  dipropionate (DIPROLENE -AF) 0.05 % cream      benzonatate  (TESSALON ) 200 MG capsule Take 1 capsule (200 mg total) by mouth 3 (three) times daily as needed. 45 capsule 3   carboxymethylcellulose (REFRESH TEARS) 0.5 % SOLN Apply to eye.     EPINEPHrine  (EPIPEN  2-PAK) 0.3 mg/0.3 mL IJ SOAJ injection as directed Injection 1 each 1   fexofenadine (ALLEGRA) 180 MG tablet Take 180 mg by mouth daily.     fluticasone -salmeterol (ADVAIR) 250-50 MCG/ACT AEPB Inhale 1 puff into the lungs every 12 (twelve) hours. 60 each 5   latanoprost (XALATAN) 0.005 % ophthalmic solution Place 1 drop into the left eye nightly.     olmesartan  (BENICAR ) 5 MG tablet Take 1 tablet (5 mg total) by mouth daily. 90 tablet 3   omeprazole  (PRILOSEC) 40 MG capsule Take 1 capsule by mouth once daily 30 capsule 0   OVER THE COUNTER MEDICATION Occuvite-daily     Probiotic Product (PROBIOTIC PO) Take by mouth.     rosuvastatin  (CRESTOR ) 20 MG tablet Take 1 tablet (20 mg total) by mouth daily. 90 tablet 3   sildenafil  (REVATIO ) 20 MG tablet Take 1-5 tablets PRN 90 tablet 2   VITAMIN D PO Take 250 mg by mouth daily.     No current facility-administered medications on file prior to visit.    BP 120/80   Pulse 76   Temp 98 F (36.7 C) (Oral)   Ht 5\' 11"  (1.803 m)   Wt 230 lb (104.3 kg)   SpO2 97%   BMI 32.08 kg/m       Objective:   Physical Exam Vitals and nursing note reviewed.  Constitutional:       Appearance: He is well-developed.  Abdominal:     General: Bowel sounds are normal. There is no distension.     Palpations: Abdomen is soft. There is no mass.     Tenderness: There  is abdominal tenderness.     Comments: Pain with palpation to abdomen under his ribs. When he changed positions you could feel the cramping in his abdomen.  Became symptomatic with laying on bed and changing positions   Genitourinary:    Rectum: Normal.  Skin:    General: Skin is warm and dry.     Capillary Refill: Capillary refill takes less than 2 seconds.  Neurological:     General: No focal deficit present.     Mental Status: He is alert and oriented to person, place, and time.  Psychiatric:        Mood and Affect: Mood normal.        Behavior: Behavior normal.        Thought Content: Thought content normal.        Judgment: Judgment normal.           Assessment & Plan:  1. Strain of abdominal wall, initial encounter (Primary) - Will send in Flexeril. This appears as an abdominal wall strain. Advised to take it easy for the next few days. He can walk abut refrain from lifting and yard work  - Follow up if not resolving in the next 2-3 days  - cyclobenzaprine (FLEXERIL) 10 MG tablet; Take 1 tablet (10 mg total) by mouth at bedtime.  Dispense: 15 tablet; Refill: 0  Alto Atta, NP

## 2024-02-26 ENCOUNTER — Other Ambulatory Visit (HOSPITAL_COMMUNITY): Payer: Self-pay

## 2024-03-03 ENCOUNTER — Other Ambulatory Visit: Payer: Self-pay | Admitting: Adult Health

## 2024-03-03 DIAGNOSIS — K2100 Gastro-esophageal reflux disease with esophagitis, without bleeding: Secondary | ICD-10-CM

## 2024-03-12 DIAGNOSIS — M25571 Pain in right ankle and joints of right foot: Secondary | ICD-10-CM | POA: Diagnosis not present

## 2024-03-13 ENCOUNTER — Other Ambulatory Visit: Payer: Self-pay | Admitting: Adult Health

## 2024-03-13 DIAGNOSIS — I10 Essential (primary) hypertension: Secondary | ICD-10-CM

## 2024-03-26 DIAGNOSIS — G4733 Obstructive sleep apnea (adult) (pediatric): Secondary | ICD-10-CM | POA: Diagnosis not present

## 2024-04-01 ENCOUNTER — Other Ambulatory Visit: Payer: Self-pay | Admitting: Adult Health

## 2024-04-01 DIAGNOSIS — K2100 Gastro-esophageal reflux disease with esophagitis, without bleeding: Secondary | ICD-10-CM

## 2024-04-23 ENCOUNTER — Encounter: Payer: Self-pay | Admitting: Adult Health

## 2024-04-23 ENCOUNTER — Ambulatory Visit (INDEPENDENT_AMBULATORY_CARE_PROVIDER_SITE_OTHER): Admitting: Adult Health

## 2024-04-23 VITALS — BP 130/80 | HR 83 | Temp 98.1°F | Ht 71.0 in | Wt 235.0 lb

## 2024-04-23 DIAGNOSIS — H6690 Otitis media, unspecified, unspecified ear: Secondary | ICD-10-CM | POA: Diagnosis not present

## 2024-04-23 MED ORDER — DOXYCYCLINE HYCLATE 100 MG PO CAPS: 100.0000 mg | ORAL_CAPSULE | Freq: Two times a day (BID) | ORAL | 0 refills | Status: DC

## 2024-04-23 NOTE — Progress Notes (Signed)
 Subjective:    Patient ID: Raymond Mann, male    DOB: May 22, 1956, 68 y.o.   MRN: [id number]  HPI  68 year old male who  has a past medical history of Cataract of right eye, ED (erectile dysfunction), Essential hypertension, Fatty liver, Hyperlipidemia, Myopia, Ocular disease, Pseudophakia of left eye, Retinal detachment, Shingles, and Sight deterioration.  He presents to the office today for an acute issue. He reports left ear pain for the last week or so. He has noticed an occasional drainage. He has had cellulitis of the left ear in the past and this seems to be a similar sensation.   Review of Systems See HPI   Past Medical History:  Diagnosis Date   Cataract of right eye    ED (erectile dysfunction)    Essential hypertension    Fatty liver    Hyperlipidemia    Myopia    Ocular disease    Pseudophakia of left eye    Retinal detachment    both eyes   Shingles    Sight deterioration    right eye    Social History   Socioeconomic History   Marital status: Married    Spouse name: Not on file   Number of children: 2   Years of education: Not on file   Highest education level: Bachelor's degree (e.g., BA, AB, BS)  Occupational History   Not on file  Tobacco Use   Smoking status: Never   Smokeless tobacco: Never  Substance and Sexual Activity   Alcohol use: Yes    Comment: occasionally   Drug use: No   Sexual activity: Not on file  Other Topics Concern   Not on file  Social History 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    One grandchild       Social Drivers of Health   Financial Resource Strain: Low Risk  (09/26/2022)   Overall Financial Resource Strain (CARDIA)    Difficulty of Paying Living Expenses: Not hard at all  Food Insecurity: No Food Insecurity (09/26/2022)   Hunger Vital Sign    Worried About Running Out of Food in the Last Year: Never true    Ran Out of Food in the Last Year: Never true  Transportation  Needs: No Transportation Needs (09/26/2022)   PRAPARE - Administrator, Civil Service (Medical): No    Lack of Transportation (Non-Medical): No  Physical Activity: Inactive (09/26/2022)   Exercise Vital Sign    Days of Exercise per Week: 0 days    Minutes of Exercise per Session: 0 min  Stress: No Stress Concern Present (09/26/2022)   [employer] of Occupational Health - Occupational Stress Questionnaire    Feeling of Stress : Not at all  Social Connections: Socially Integrated (09/26/2022)   Social Connection and Isolation Panel    Frequency of Communication with Friends and Family: More than three times a week    Frequency of Social Gatherings with Friends and Family: More than three times a week    Attends Religious Services: More than 4 times per year    Active Member of [employer] or Organizations: Yes    Attends Banker Meetings: More than 4 times per year    Marital Status: Married  Catering manager Violence: Not At Risk (09/26/2022)   Humiliation, Afraid, Rape, and Kick questionnaire    Fear of Current or Ex-Partner: No  Emotionally Abused: No    Physically Abused: No    Sexually Abused: No    Past Surgical History:  Procedure Laterality Date   bilateral retinal detachment     x2 each eye   CAPSULOTOMY  10/09/1999   left eye   cryopexy  10/08/1993   right eye   ENDOVENOUS ABLATION SAPHENOUS VEIN W/ LASER Left 05/02/2023   endovenous laser ablation left small saphenous vein and stab phlebectomy 10-20 incisions left leg by Penne Colorado MD   scleral buckling  10/08/1993   left eye   VITRECTOMY  10/08/1993   both eyes    Family History  Problem Relation Age of Onset   Stroke Father    Heart attack Father    Dementia Mother    Stomach cancer Mother 55   Retinal detachment Brother     Allergies  Allergen Reactions   Bee Venom Swelling   Lisinopril Cough   Norvasc  [Amlodipine ]     Leg swelling    Nutritional Supplements Other (See  Comments)    Itching, possibly    Current Outpatient Medications on File Prior to Visit  Medication Sig Dispense Refill   albuterol  (VENTOLIN  HFA) 108 (90 Base) MCG/ACT inhaler Inhale 2 puffs into the lungs every 6 (six) hours as needed for wheezing or shortness of breath. 8 g 0   augmented betamethasone  dipropionate (DIPROLENE -AF) 0.05 % cream      benzonatate  (TESSALON ) 200 MG capsule Take 1 capsule (200 mg total) by mouth 3 (three) times daily as needed. 45 capsule 3   carboxymethylcellulose (REFRESH TEARS) 0.5 % SOLN Apply to eye.     cyclobenzaprine  (FLEXERIL ) 10 MG tablet Take 1 tablet (10 mg total) by mouth at bedtime. 15 tablet 0   EPINEPHrine  (EPIPEN  2-PAK) 0.3 mg/0.3 mL IJ SOAJ injection as directed Injection 1 each 1   fexofenadine (ALLEGRA) 180 MG tablet Take 180 mg by mouth daily.     fluticasone -salmeterol (ADVAIR) 250-50 MCG/ACT AEPB Inhale 1 puff into the lungs every 12 (twelve) hours. 60 each 5   latanoprost (XALATAN) 0.005 % ophthalmic solution Place 1 drop into the left eye nightly.     meloxicam (MOBIC) 15 MG tablet Take 15 mg by mouth daily.     olmesartan  (BENICAR ) 5 MG tablet Take 1 tablet by mouth once daily 90 tablet 0   omeprazole  (PRILOSEC) 40 MG capsule Take 1 capsule by mouth once daily 30 capsule 0   OVER THE COUNTER MEDICATION Occuvite-daily     Probiotic Product (PROBIOTIC PO) Take by mouth.     rosuvastatin  (CRESTOR ) 20 MG tablet Take 1 tablet (20 mg total) by mouth daily. 90 tablet 3   sildenafil  (REVATIO ) 20 MG tablet Take 1-5 tablets PRN 90 tablet 2   VITAMIN D PO Take 250 mg by mouth daily.     No current facility-administered medications on file prior to visit.    BP 130/80   Pulse 83   Temp 98.1 F (36.7 C) (Oral)   Ht 5' 11 (1.803 m)   Wt 235 lb (106.6 kg)   SpO2 97%   BMI 32.78 kg/m       Objective:   Physical Exam Vitals and nursing note reviewed.  Constitutional:      Appearance: Normal appearance.  HENT:     Left Ear: No  decreased hearing noted. Drainage and tenderness present. No swelling. No mastoid tenderness. Tympanic membrane is not erythematous or bulging.     Ears:     Comments:  He has what appears to be a small laceration in his ear canal that is infected.  Skin:    General: Skin is warm and dry.  Neurological:     General: No focal deficit present.     Mental Status: He is alert and oriented to person, place, and time.  Psychiatric:        Mood and Affect: Mood normal.        Behavior: Behavior normal.        Thought Content: Thought content normal.        Judgment: Judgment normal.        Assessment & Plan:  1. Ear infection (Primary)  - doxycycline  (VIBRAMYCIN ) 100 MG capsule; Take 1 capsule (100 mg total) by mouth 2 (two) times daily.  Dispense: 14 capsule; Refill: 0 - Follow up if not resolved in the next week   Malaiah Viramontes, NP

## 2024-04-27 ENCOUNTER — Other Ambulatory Visit: Payer: Self-pay | Admitting: Adult Health

## 2024-04-27 DIAGNOSIS — K2100 Gastro-esophageal reflux disease with esophagitis, without bleeding: Secondary | ICD-10-CM

## 2024-05-01 ENCOUNTER — Ambulatory Visit: Admitting: Pulmonary Disease

## 2024-05-01 ENCOUNTER — Encounter

## 2024-05-14 DIAGNOSIS — D1809 Hemangioma of other sites: Secondary | ICD-10-CM | POA: Diagnosis not present

## 2024-05-14 DIAGNOSIS — D485 Neoplasm of uncertain behavior of skin: Secondary | ICD-10-CM | POA: Diagnosis not present

## 2024-05-24 ENCOUNTER — Other Ambulatory Visit: Payer: Self-pay | Admitting: Adult Health

## 2024-05-24 DIAGNOSIS — K2100 Gastro-esophageal reflux disease with esophagitis, without bleeding: Secondary | ICD-10-CM

## 2024-06-08 ENCOUNTER — Other Ambulatory Visit: Payer: Self-pay | Admitting: Adult Health

## 2024-06-08 DIAGNOSIS — I10 Essential (primary) hypertension: Secondary | ICD-10-CM

## 2024-06-10 ENCOUNTER — Other Ambulatory Visit: Payer: Self-pay | Admitting: *Deleted

## 2024-06-10 DIAGNOSIS — J4530 Mild persistent asthma, uncomplicated: Secondary | ICD-10-CM

## 2024-06-12 ENCOUNTER — Ambulatory Visit (INDEPENDENT_AMBULATORY_CARE_PROVIDER_SITE_OTHER): Admitting: Pulmonary Disease

## 2024-06-12 ENCOUNTER — Ambulatory Visit: Admitting: Pulmonary Disease

## 2024-06-12 ENCOUNTER — Telehealth: Payer: Self-pay

## 2024-06-12 VITALS — BP 121/75 | HR 82 | Ht 72.0 in | Wt 238.0 lb

## 2024-06-12 DIAGNOSIS — J4530 Mild persistent asthma, uncomplicated: Secondary | ICD-10-CM | POA: Diagnosis not present

## 2024-06-12 DIAGNOSIS — R053 Chronic cough: Secondary | ICD-10-CM | POA: Diagnosis not present

## 2024-06-12 DIAGNOSIS — G4733 Obstructive sleep apnea (adult) (pediatric): Secondary | ICD-10-CM

## 2024-06-12 LAB — PULMONARY FUNCTION TEST
DL/VA % pred: 111 %
DL/VA: 4.51 ml/min/mmHg/L
DLCO cor % pred: 96 %
DLCO cor: 26.82 ml/min/mmHg
DLCO unc % pred: 96 %
DLCO unc: 26.82 ml/min/mmHg
FEF 25-75 Post: 5.07 L/s
FEF 25-75 Pre: 4.12 L/s
FEF2575-%Change-Post: 23 %
FEF2575-%Pred-Post: 186 %
FEF2575-%Pred-Pre: 151 %
FEV1-%Change-Post: 7 %
FEV1-%Pred-Post: 107 %
FEV1-%Pred-Pre: 100 %
FEV1-Post: 3.83 L
FEV1-Pre: 3.57 L
FEV1FVC-%Change-Post: -1 %
FEV1FVC-%Pred-Pre: 113 %
FEV6-%Change-Post: 8 %
FEV6-%Pred-Post: 101 %
FEV6-%Pred-Pre: 93 %
FEV6-Post: 4.62 L
FEV6-Pre: 4.27 L
FEV6FVC-%Change-Post: 0 %
FEV6FVC-%Pred-Post: 105 %
FEV6FVC-%Pred-Pre: 105 %
FVC-%Change-Post: 8 %
FVC-%Pred-Post: 96 %
FVC-%Pred-Pre: 88 %
FVC-Post: 4.64 L
FVC-Pre: 4.27 L
Post FEV1/FVC ratio: 83 %
Post FEV6/FVC ratio: 100 %
Pre FEV1/FVC ratio: 84 %
Pre FEV6/FVC Ratio: 100 %
RV % pred: 81 %
RV: 2.07 L
TLC % pred: 90 %
TLC: 6.7 L

## 2024-06-12 MED ORDER — TIRZEPATIDE-WEIGHT MANAGEMENT 2.5 MG/0.5ML ~~LOC~~ SOLN
2.5000 mg | SUBCUTANEOUS | 4 refills | Status: AC
Start: 2024-06-12 — End: ?

## 2024-06-12 NOTE — Patient Instructions (Signed)
 Full PFT performed today.

## 2024-06-12 NOTE — Progress Notes (Signed)
 Full PFT performed today.

## 2024-06-12 NOTE — Telephone Encounter (Signed)
 Zepbound  rx must be sent to pharmacy. This is not a specialty medication. Pharmacy will initiate PA and the RX PRIOR AUTH team will manage auth  Thanks  Sherry Pennant, PharmD, MPH, BCPS, CPP Clinical Pharmacist [facility])

## 2024-06-12 NOTE — Patient Instructions (Signed)
 Continue using your CPAP nightly  Download from machine shows that is working well  Continue Advair  Initiation of Zepbound   Follow-up in about 3 months

## 2024-06-12 NOTE — Progress Notes (Signed)
 Raymond Mann    [id number]    November 09, 1955  Primary Care Physician:Nafziger, Darleene, NP  Referring Physician: Merna Darleene, NP [address] [place],  [place] [address]  Chief complaint: Chronic cough Obstructive sleep apnea  HPI:  Diagnosis of severe obstructive sleep apnea Tolerating CPAP well Waking up feeling like his good nights rest  Continues to tolerate his CPAP well and continues to use it nightly  Had discussions regarding weight loss and treatment for sleep disordered breathing Talked about trial with Zepbound   His cough is better Continues to use Advair Reflux symptoms better  He continues on medications for allergies  He does use Tessalon  Perles  Hypertension is well-controlled  Never smoked, was exposed to secondhand smoke  Outpatient Encounter Medications as of 06/12/2024  Medication Sig   albuterol  (VENTOLIN  HFA) 108 (90 Base) MCG/ACT inhaler Inhale 2 puffs into the lungs every 6 (six) hours as needed for wheezing or shortness of breath.   augmented betamethasone  dipropionate (DIPROLENE -AF) 0.05 % cream    carboxymethylcellulose (REFRESH TEARS) 0.5 % SOLN Apply to eye.   cyclobenzaprine  (FLEXERIL ) 10 MG tablet Take 1 tablet (10 mg total) by mouth at bedtime.   EPINEPHrine  (EPIPEN  2-PAK) 0.3 mg/0.3 mL IJ SOAJ injection as directed Injection   fluticasone -salmeterol (ADVAIR) 250-50 MCG/ACT AEPB Inhale 1 puff into the lungs every 12 (twelve) hours.   latanoprost (XALATAN) 0.005 % ophthalmic solution Place 1 drop into the left eye nightly.   meloxicam (MOBIC) 15 MG tablet Take 15 mg by mouth daily.   olmesartan  (BENICAR ) 5 MG tablet Take 1 tablet by mouth once daily   omeprazole  (PRILOSEC) 40 MG capsule Take 1 capsule by mouth once daily   OVER THE COUNTER MEDICATION Occuvite-daily   Probiotic Product (PROBIOTIC PO) Take by mouth.   rosuvastatin  (CRESTOR ) 20 MG tablet Take 1 tablet (20 mg total) by mouth daily.   sildenafil  (REVATIO ) 20 MG  tablet Take 1-5 tablets PRN   VITAMIN D PO Take 250 mg by mouth daily.   doxycycline  (VIBRAMYCIN ) 100 MG capsule Take 1 capsule (100 mg total) by mouth 2 (two) times daily. (Patient not taking: Reported on 06/12/2024)   fexofenadine (ALLEGRA) 180 MG tablet Take 180 mg by mouth daily. (Patient not taking: Reported on 06/12/2024)   No facility-administered encounter medications on file as of 06/12/2024.    Allergies as of 06/12/2024 - Review Complete 06/12/2024  Allergen Reaction Noted   Bee venom Swelling 07/25/2020   Lisinopril Cough 09/29/2020   Norvasc  [amlodipine ]  03/06/2023   Nutritional supplements Other (See Comments) 01/25/2012    Past Medical History:  Diagnosis Date   Cataract of right eye    ED (erectile dysfunction)    Essential hypertension    Fatty liver    Hyperlipidemia    Myopia    Ocular disease    Pseudophakia of left eye    Retinal detachment    both eyes   Shingles    Sight deterioration    right eye    Past Surgical History:  Procedure Laterality Date   bilateral retinal detachment     x2 each eye   CAPSULOTOMY  10/09/1999   left eye   cryopexy  10/08/1993   right eye   ENDOVENOUS ABLATION SAPHENOUS VEIN W/ LASER Left 05/02/2023   endovenous laser ablation left small saphenous vein and stab phlebectomy 10-20 incisions left leg by Penne Colorado MD   scleral buckling  10/08/1993   left  eye   VITRECTOMY  10/08/1993   both eyes    Family History  Problem Relation Age of Onset   Stroke Father    Heart attack Father    Dementia Mother    Stomach cancer Mother 11   Retinal detachment Brother     Social History   Socioeconomic History   Marital status: Married    Spouse name: Not on file   Number of children: 2   Years of education: Not on file   Highest education level: Bachelor's degree (e.g., BA, AB, BS)  Occupational History   Not on file  Tobacco Use   Smoking status: Never   Smokeless tobacco: Never  Substance and Sexual Activity    Alcohol use: Yes    Comment: occasionally   Drug use: No   Sexual activity: Not on file  Other Topics Concern   Not on file  Social History 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    One grandchild       Social Drivers of Health   Financial Resource Strain: Low Risk  (09/26/2022)   Overall Financial Resource Strain (CARDIA)    Difficulty of Paying Living Expenses: Not hard at all  Food Insecurity: No Food Insecurity (09/26/2022)   Hunger Vital Sign    Worried About Running Out of Food in the Last Year: Never true    Ran Out of Food in the Last Year: Never true  Transportation Needs: No Transportation Needs (09/26/2022)   PRAPARE - Administrator, Civil Service (Medical): No    Lack of Transportation (Non-Medical): No  Physical Activity: Inactive (09/26/2022)   Exercise Vital Sign    Days of Exercise per Week: 0 days    Minutes of Exercise per Session: 0 min  Stress: No Stress Concern Present (09/26/2022)   [employer] of Occupational Health - Occupational Stress Questionnaire    Feeling of Stress : Not at all  Social Connections: Socially Integrated (09/26/2022)   Social Connection and Isolation Panel    Frequency of Communication with Friends and Family: More than three times a week    Frequency of Social Gatherings with Friends and Family: More than three times a week    Attends Religious Services: More than 4 times per year    Active Member of [employer] or Organizations: Yes    Attends Engineer, structural: More than 4 times per year    Marital Status: Married  Catering manager Violence: Not At Risk (09/26/2022)   Humiliation, Afraid, Rape, and Kick questionnaire    Fear of Current or Ex-Partner: No    Emotionally Abused: No    Physically Abused: No    Sexually Abused: No    Review of Systems  Respiratory:  Positive for apnea and cough.   Psychiatric/Behavioral:  Positive for sleep disturbance.     Vitals:    06/12/24 1058  BP: 121/75  Pulse: 82  SpO2: 97%     Physical Exam Constitutional:      Appearance: He is obese.  HENT:     Head: Normocephalic.     Mouth/Throat:     Mouth: Mucous membranes are moist.  Eyes:     General: No scleral icterus. Cardiovascular:     Rate and Rhythm: Normal rate and regular rhythm.     Heart sounds: No murmur heard.    No friction rub.  Pulmonary:     Effort: No  respiratory distress.     Breath sounds: No stridor. No wheezing or rhonchi.  Musculoskeletal:     Cervical back: No rigidity or tenderness.  Neurological:     Mental Status: He is alert.  Psychiatric:        Mood and Affect: Mood normal.    Data Reviewed: Previous pulmonary function test shows airway obstruction with flattening of the expiratory loop - Concerning for variable intrathoracic obstruction, there may be blunting of the inspiratory loop as well which will be in favor of a fixed obstruction-if both lobes are blunted  Repeat pulmonary function test 06/12/2024 shows no obstruction, no significant bronchodilator response, no restriction, normal diffusing capacity Flow-volume loop looks normal  Sleep study shows severe obstructive sleep apnea with AHI of 68  Download from the machine shows excellent compliance of 99% Average use of 9 hours 13 minutes AutoSet 5-15 EPR level of 3 Residual AHI of 0.9, 95 percentile pressure of 9.4  Assessment:  Chronic cough is better - Continues on Advair  Obstructive sleep apnea - Continues to tolerate his CPAP well and appears to be helping his sleep  PFT is better on the repeat  Class I obesity With his obesity and severe obstructive sleep apnea We did discuss weight loss and medication that may help weight loss and help his sleep disordered breathing - We agreed on initiation of Zepbound   Plan/Recommendations: Continue Advair 250  Continue other medications for reflux  Will initiate Zepbound   I will see him back in about 3  months  Prescription for Zepbound  was written, information material provided regarding Zepbound   I spent 30 minutes dedicated to the care of this patient on the date of this encounter to include previsit review of records, face-to-face time with the patient discussing conditions above, post visit ordering of testing,ordering medications,independentlyinterpreting results, clinical documentation with electronic health record and communicated necessary findings to members of the patient's care team   Jennet Epley MD [facility] 06/12/2024, 11:23 AM  CC: Merna Huxley, NP

## 2024-06-12 NOTE — Telephone Encounter (Signed)
*  Pulm  Pharmacy Patient Advocate Encounter   Received notification from Fax that prior authorization for Zepbound  2.5MG /0.5ML pen-injectors   is required/requested.   Insurance verification completed.   The patient is insured through [facility] ADVANTAGE/RX ADVANCE .   Per test claim: PA required; PA submitted to above mentioned insurance via Latent Key/confirmation #/EOC [id number] Status is pending

## 2024-06-15 NOTE — Telephone Encounter (Signed)
FYI Zepbound denied

## 2024-06-15 NOTE — Telephone Encounter (Signed)
 Pharmacy Patient Advocate Encounter  Received notification from [employer]/RX ADVANCE that Prior Authorization for Zepbound  2.5mg  has been DENIED.  Full denial letter will be uploaded to the media tab. See denial reason below.

## 2024-06-25 DIAGNOSIS — G4733 Obstructive sleep apnea (adult) (pediatric): Secondary | ICD-10-CM | POA: Diagnosis not present

## 2024-07-07 ENCOUNTER — Ambulatory Visit: Payer: Self-pay | Admitting: Pulmonary Disease

## 2024-07-20 ENCOUNTER — Other Ambulatory Visit: Payer: Self-pay | Admitting: Pulmonary Disease

## 2024-07-22 ENCOUNTER — Other Ambulatory Visit: Payer: Self-pay | Admitting: Adult Health

## 2024-07-22 DIAGNOSIS — K2100 Gastro-esophageal reflux disease with esophagitis, without bleeding: Secondary | ICD-10-CM

## 2024-08-07 DIAGNOSIS — H33023 Retinal detachment with multiple breaks, bilateral: Secondary | ICD-10-CM | POA: Diagnosis not present

## 2024-08-07 DIAGNOSIS — H4302 Vitreous prolapse, left eye: Secondary | ICD-10-CM | POA: Diagnosis not present

## 2024-08-07 DIAGNOSIS — H35033 Hypertensive retinopathy, bilateral: Secondary | ICD-10-CM | POA: Diagnosis not present

## 2024-08-07 DIAGNOSIS — H3581 Retinal edema: Secondary | ICD-10-CM | POA: Diagnosis not present

## 2024-08-07 DIAGNOSIS — Z961 Presence of intraocular lens: Secondary | ICD-10-CM | POA: Diagnosis not present

## 2024-08-07 DIAGNOSIS — T8522XD Displacement of intraocular lens, subsequent encounter: Secondary | ICD-10-CM | POA: Diagnosis not present

## 2024-08-14 DIAGNOSIS — H40053 Ocular hypertension, bilateral: Secondary | ICD-10-CM | POA: Diagnosis not present

## 2024-08-23 ENCOUNTER — Other Ambulatory Visit: Payer: Self-pay | Admitting: Adult Health

## 2024-08-23 DIAGNOSIS — K2100 Gastro-esophageal reflux disease with esophagitis, without bleeding: Secondary | ICD-10-CM

## 2024-09-07 ENCOUNTER — Other Ambulatory Visit: Payer: Self-pay | Admitting: Adult Health

## 2024-09-07 DIAGNOSIS — I10 Essential (primary) hypertension: Secondary | ICD-10-CM

## 2024-09-08 ENCOUNTER — Other Ambulatory Visit: Payer: Self-pay | Admitting: Adult Health

## 2024-09-08 ENCOUNTER — Other Ambulatory Visit: Payer: Self-pay | Admitting: Pulmonary Disease

## 2024-09-08 DIAGNOSIS — R051 Acute cough: Secondary | ICD-10-CM

## 2024-09-08 DIAGNOSIS — J988 Other specified respiratory disorders: Secondary | ICD-10-CM

## 2024-09-16 ENCOUNTER — Encounter: Payer: Self-pay | Admitting: Adult Health

## 2024-09-16 ENCOUNTER — Telehealth: Payer: Self-pay

## 2024-09-16 ENCOUNTER — Ambulatory Visit: Admitting: Adult Health

## 2024-09-16 ENCOUNTER — Other Ambulatory Visit (HOSPITAL_COMMUNITY): Payer: Self-pay

## 2024-09-16 VITALS — BP 120/80 | HR 96 | Temp 98.9°F | Ht 72.0 in | Wt 235.0 lb

## 2024-09-16 DIAGNOSIS — N138 Other obstructive and reflux uropathy: Secondary | ICD-10-CM

## 2024-09-16 DIAGNOSIS — M940 Chondrocostal junction syndrome [Tietze]: Secondary | ICD-10-CM | POA: Diagnosis not present

## 2024-09-16 DIAGNOSIS — N401 Enlarged prostate with lower urinary tract symptoms: Secondary | ICD-10-CM | POA: Diagnosis not present

## 2024-09-16 DIAGNOSIS — J069 Acute upper respiratory infection, unspecified: Secondary | ICD-10-CM

## 2024-09-16 MED ORDER — TAMSULOSIN HCL 0.4 MG PO CAPS: 0.4000 mg | ORAL_CAPSULE | Freq: Every day | ORAL | 3 refills | Status: AC

## 2024-09-16 MED ORDER — HYDROCODONE BIT-HOMATROP MBR 5-1.5 MG/5ML PO SOLN: 5.0000 mL | Freq: Three times a day (TID) | ORAL | 0 refills | Status: AC | PRN

## 2024-09-16 MED ORDER — CYCLOBENZAPRINE HCL 10 MG PO TABS: 10.0000 mg | ORAL_TABLET | Freq: Every day | ORAL | 0 refills | Status: AC

## 2024-09-16 MED ORDER — BENZONATATE 200 MG PO CAPS: 200.0000 mg | ORAL_CAPSULE | Freq: Three times a day (TID) | ORAL | 1 refills | Status: AC | PRN

## 2024-09-16 NOTE — Progress Notes (Signed)
 Subjective:    Patient ID: Raymond Mann, male    DOB: 10/26/1955, 68 y.o.   MRN: [id number]  Muscle Pain   Discussed the use of AI scribe software for clinical note transcription with the patient, who gave verbal consent to proceed.  History of Present Illness   Raymond Mann is a 68 year old male who presents for an acute visit     He has recurrent nasal congestion and cough. Symptoms began with yellow mucus that is now clear, He denies fever. He is using over-the-counter Robitussin, Asmanex, Nyquil, and Astepro. With the coughing he has developed pain to the right chest wall. He had a couple of Flexeril  left over from a previous prescription and took these which he find helpful.   He has frequent urination for at least a year, often shortly after drinking fluids. He denies burning with urination or weak stream. He wakes at least once nightly to urinate. He denies decreased stream but does not empty his bladder completely.  He mainly drinks water and has little coffee or soda.        Review of Systems See HPI   Past Medical History:  Diagnosis Date   Cataract of right eye    ED (erectile dysfunction)    Essential hypertension    Fatty liver    Hyperlipidemia    Myopia    Ocular disease    Pseudophakia of left eye    Retinal detachment    both eyes   Shingles    Sight deterioration    right eye    Social History   Socioeconomic History   Marital status: Married    Spouse name: Not on file   Number of children: 2   Years of education: Not on file   Highest education level: Bachelor's degree (e.g., BA, AB, BS)  Occupational History   Not on file  Tobacco Use   Smoking status: Never   Smokeless tobacco: Never  Substance and Sexual Activity   Alcohol use: Yes    Comment: occasionally   Drug use: No   Sexual activity: Not on file  Other Topics Concern   Not on file  Social History 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     One grandchild       Social Drivers of Health   Financial Resource Strain: Low Risk  (09/26/2022)   Overall Financial Resource Strain (CARDIA)    Difficulty of Paying Living Expenses: Not hard at all  Food Insecurity: No Food Insecurity (09/26/2022)   Hunger Vital Sign    Worried About Running Out of Food in the Last Year: Never true    Ran Out of Food in the Last Year: Never true  Transportation Needs: No Transportation Needs (09/26/2022)   PRAPARE - Administrator, Civil Service (Medical): No    Lack of Transportation (Non-Medical): No  Physical Activity: Inactive (09/26/2022)   Exercise Vital Sign    Days of Exercise per Week: 0 days    Minutes of Exercise per Session: 0 min  Stress: No Stress Concern Present (09/26/2022)   [employer] of Occupational Health - Occupational Stress Questionnaire    Feeling of Stress : Not at all  Social Connections: Socially Integrated (09/26/2022)   Social Connection and Isolation Panel    Frequency of Communication with Friends and Family: More than three times a week    Frequency of  Social Gatherings with Friends and Family: More than three times a week    Attends Religious Services: More than 4 times per year    Active Member of [employer] or Organizations: Yes    Attends Engineer, Structural: More than 4 times per year    Marital Status: Married  Catering Manager Violence: Not At Risk (09/26/2022)   Humiliation, Afraid, Rape, and Kick questionnaire    Fear of Current or Ex-Partner: No    Emotionally Abused: No    Physically Abused: No    Sexually Abused: No    Past Surgical History:  Procedure Laterality Date   bilateral retinal detachment     x2 each eye   CAPSULOTOMY  10/09/1999   left eye   cryopexy  10/08/1993   right eye   ENDOVENOUS ABLATION SAPHENOUS VEIN W/ LASER Left 05/02/2023   endovenous laser ablation left small saphenous vein and stab phlebectomy 10-20 incisions left leg by Penne Colorado MD    scleral buckling  10/08/1993   left eye   VITRECTOMY  10/08/1993   both eyes    Family History  Problem Relation Age of Onset   Stroke Father    Heart attack Father    Dementia Mother    Stomach cancer Mother 78   Retinal detachment Brother     Allergies  Allergen Reactions   Bee Venom Swelling   Lisinopril Cough   Norvasc  [Amlodipine ]     Leg swelling    Nutritional Supplements Other (See Comments)    Itching, possibly    Current Outpatient Medications on File Prior to Visit  Medication Sig Dispense Refill   albuterol  (VENTOLIN  HFA) 108 (90 Base) MCG/ACT inhaler INHALE 2 PUFFS BY MOUTH EVERY 6 HOURS AS NEEDED FOR WHEEZING OR  SHORTNESS  OF  BREATH. 9 g 0   augmented betamethasone  dipropionate (DIPROLENE -AF) 0.05 % cream      carboxymethylcellulose (REFRESH TEARS) 0.5 % SOLN Apply to eye.     EPINEPHrine  (EPIPEN  2-PAK) 0.3 mg/0.3 mL IJ SOAJ injection as directed Injection 1 each 1   fluticasone -salmeterol (ADVAIR) 250-50 MCG/ACT AEPB INHALE 1 DOSE BY MOUTH EVERY 12 HOURS 60 each 0   latanoprost (XALATAN) 0.005 % ophthalmic solution Place 1 drop into the left eye nightly.     meloxicam (MOBIC) 15 MG tablet Take 15 mg by mouth daily.     olmesartan  (BENICAR ) 5 MG tablet Take 1 tablet by mouth once daily 90 tablet 0   omeprazole  (PRILOSEC) 40 MG capsule Take 1 capsule by mouth once daily 30 capsule 0   OVER THE COUNTER MEDICATION Occuvite-daily     Probiotic Product (PROBIOTIC PO) Take by mouth.     rosuvastatin  (CRESTOR ) 20 MG tablet Take 1 tablet (20 mg total) by mouth daily. 90 tablet 3   sildenafil  (REVATIO ) 20 MG tablet Take 1-5 tablets PRN 90 tablet 2   tirzepatide  (ZEPBOUND ) 2.5 MG/0.5ML injection vial Inject 2.5 mg into the skin once a week. 0.5 mL 4   VITAMIN D PO Take 250 mg by mouth daily.     No current facility-administered medications on file prior to visit.    BP 120/80   Pulse 96   Temp 98.9 F (37.2 C) (Oral)   Ht 6' (1.829 m)   Wt 235 lb (106.6 kg)    SpO2 96%   BMI 31.87 kg/m       Objective:   Physical Exam Vitals and nursing note reviewed.  Constitutional:  Appearance: Normal appearance.  HENT:     Nose: Rhinorrhea present. No congestion. Rhinorrhea is clear.     Right Turbinates: Not enlarged or swollen.     Left Turbinates: Not enlarged or swollen.  Cardiovascular:     Rate and Rhythm: Normal rate and regular rhythm.     Pulses: Normal pulses.     Heart sounds: Normal heart sounds.  Pulmonary:     Effort: Pulmonary effort is normal.     Breath sounds: Normal breath sounds. No wheezing or rhonchi.  Skin:    General: Skin is warm and dry.  Neurological:     General: No focal deficit present.     Mental Status: He is alert and oriented to person, place, and time.  Psychiatric:        Mood and Affect: Mood normal.        Behavior: Behavior normal.        Thought Content: Thought content normal.        Judgment: Judgment normal.        Assessment & Plan:  Assessment and Plan    Acute upper respiratory infection Persistent cough with clear mucus, suggestive of post-nasal drip. - Continue Mucinex. - Prescribed Tessalon  Perles. - Prescribed hydrocodone cough syrup for nighttime.  Costochondritis Inflammation likely from coughing. - Prescribed Flexeril . - Can OTC Nsaids.   Benign prostatic hyperplasia with lower urinary tract symptoms Frequent urination, nocturia, and incomplete bladder emptying suggest BPH. Previous normal prostate exam. Discussed treatment options, Flomax preferred - Prescribed Flomax. - Follow up if not improving in the next few weeks.      I personally spent a total of 30 minutes in the care of the patient today including preparing to see the patient, getting/reviewing separately obtained history, performing a medically appropriate exam/evaluation, counseling and educating, placing orders, and documenting clinical information in the EHR  Darleene Shape, NP .

## 2024-09-16 NOTE — Telephone Encounter (Signed)
 Pharmacy Patient Advocate Encounter   Received notification from Onbase that prior authorization for Cyclobenzaprine  HCl 10MG  tablets is required/requested.   Insurance verification completed.   The patient is insured through [facility] ADVANTAGE/RX ADVANCE.   Per test claim: PA required; PA submitted to above mentioned insurance via Latent Key/confirmation #/EOC [id number] Status is pending

## 2024-09-21 ENCOUNTER — Ambulatory Visit: Admitting: Pulmonary Disease

## 2024-09-22 ENCOUNTER — Other Ambulatory Visit: Payer: Self-pay | Admitting: Adult Health

## 2024-09-22 DIAGNOSIS — K2100 Gastro-esophageal reflux disease with esophagitis, without bleeding: Secondary | ICD-10-CM

## 2024-09-22 NOTE — Telephone Encounter (Signed)
 Pharmacy Patient Advocate Encounter  Received notification from [facility] ADVANTAGE/RX ADVANCE that Prior Authorization for Cyclobenzaprine  HCl 10MG  tablets   has been DENIED.  Full denial letter will be uploaded to the media tab. See denial reason below.  PLEASE BE ADVISE SEE DENIAL LETTER IN MEDIA OF CHART THIS WAS DENIAL FOR UTILIZATION MANAGEMENT RULES NOT MET   PA #/Case ID/Reference #: [id number]

## 2024-09-22 NOTE — Telephone Encounter (Signed)
 Noted

## 2024-09-23 NOTE — Telephone Encounter (Signed)
 Patient notified of update  and verbalized understanding. Pt stated that he was able to get this at the pharmacy for $17. No further action needed.

## 2024-09-25 ENCOUNTER — Other Ambulatory Visit: Payer: Self-pay | Admitting: Adult Health

## 2024-09-25 DIAGNOSIS — K2100 Gastro-esophageal reflux disease with esophagitis, without bleeding: Secondary | ICD-10-CM

## 2024-10-05 ENCOUNTER — Other Ambulatory Visit: Payer: Self-pay | Admitting: Pulmonary Disease

## 2024-10-22 ENCOUNTER — Other Ambulatory Visit: Payer: Self-pay | Admitting: Adult Health

## 2024-10-22 DIAGNOSIS — K2100 Gastro-esophageal reflux disease with esophagitis, without bleeding: Secondary | ICD-10-CM

## 2024-10-29 ENCOUNTER — Other Ambulatory Visit: Payer: Self-pay | Admitting: Adult Health

## 2024-10-29 DIAGNOSIS — K2100 Gastro-esophageal reflux disease with esophagitis, without bleeding: Secondary | ICD-10-CM

## 2024-11-02 ENCOUNTER — Ambulatory Visit: Admitting: Adult Health

## 2024-11-04 ENCOUNTER — Other Ambulatory Visit: Payer: Self-pay | Admitting: Pulmonary Disease

## 2024-11-09 ENCOUNTER — Other Ambulatory Visit: Payer: Self-pay | Admitting: Adult Health

## 2024-11-18 ENCOUNTER — Encounter: Admitting: Adult Health

## 2024-12-28 ENCOUNTER — Ambulatory Visit: Admitting: Pulmonary Disease
